# Patient Record
Sex: Male | Born: 1945 | Race: White | Hispanic: No | Marital: Married | Smoking: Never smoker
Health system: Southern US, Community
[De-identification: ages and names within clinical notes are randomized; demographics above are authoritative.]

## PROBLEM LIST (undated history)

## (undated) DIAGNOSIS — M1990 Unspecified osteoarthritis, unspecified site: Secondary | ICD-10-CM

## (undated) HISTORY — PX: JOINT REPLACEMENT: SHX530

---

## 1991-07-29 HISTORY — PX: EYE SURGERY: SHX253

## 1996-07-28 HISTORY — PX: KNEE ARTHROSCOPY: SHX127

## 2016-07-28 HISTORY — PX: EYE SURGERY: SHX253

## 2019-04-25 ENCOUNTER — Other Ambulatory Visit: Payer: Self-pay | Admitting: Orthopedic Surgery

## 2019-05-11 ENCOUNTER — Other Ambulatory Visit: Payer: Self-pay | Admitting: Orthopedic Surgery

## 2019-05-13 NOTE — Patient Instructions (Addendum)
DUE TO COVID-19 ONLY ONE VISITOR IS ALLOWED TO COME WITH YOU AND STAY IN THE WAITING ROOM ONLY DURING PRE OP AND PROCEDURE DAY OF SURGERY. THE 1 VISITOR MAY VISIT WITH YOU AFTER SURGERY IN YOUR PRIVATE ROOM DURING VISITING HOURS ONLY!  YOU NEED TO HAVE A COVID 19 TEST ON 05-16-19  @ 2:25 PM, THIS TEST MUST BE DONE BEFORE SURGERY, COME  Lance Gray, Lance Gray , [id number].  ([place]) ONCE YOUR COVID TEST IS COMPLETED, PLEASE BEGIN THE QUARANTINE INSTRUCTIONS AS OUTLINED IN YOUR HANDOUT.                Lance Gray  05/13/2019   Your procedure is scheduled on: 05-19-19    Report to [facility] Main  Entrance    Report to Admitting at 7:30 AM     Call this number if you have problems the morning of surgery [phone]    Remember: NO SOLID FOOD AFTER MIDNIGHT THE NIGHT PRIOR TO SURGERY. NOTHING BY MOUTH EXCEPT CLEAR LIQUIDS UNTIL 7:00 AM . PLEASE FINISH ENSURE DRINK PER SURGEON ORDER  WHICH NEEDS TO BE COMPLETED AT 7:00 AM .   CLEAR LIQUID DIET   Foods Allowed                                                                     Foods Excluded  Coffee and tea, regular and decaf                             liquids that you cannot  Plain Jell-O any favor except red or purple                                           see through such as: Fruit ices (not with fruit pulp)                                     milk, soups, orange juice  Iced Popsicles                                    All solid food Carbonated beverages, regular and diet                                    Cranberry, grape and apple juices Sports drinks like Gatorade Lightly seasoned clear broth or consume(fat free) Sugar, honey syrup   _____________________________________________________________________       Take these medicines the morning of surgery with A SIP OF WATER: None   BRUSH YOUR TEETH MORNING OF SURGERY AND RINSE YOUR MOUTH OUT, NO CHEWING GUM CANDY OR MINTS.                              You may not have any metal on your body including hair  pins and              piercings     Do not wear jewelry, cologne, lotions, powders or perfumes, deodorant                        Men may shave face and neck.   Do not bring valuables to the [facility]. [facility] IS NOT             RESPONSIBLE   FOR VALUABLES.  Contacts, dentures or bridgework may not be worn into surgery.  Leave suitcase in the car. After surgery it may be brought to your room.     Special Instructions: N/A              Please read over the following fact sheets you were given: _____________________________________________________________________             Lance Gray - Preparing for Surgery Before surgery, you can play an important role.  Because skin is not sterile, your skin needs to be as free of germs as possible.  You can reduce the number of germs on your skin by washing with CHG (chlorahexidine gluconate) soap before surgery.  CHG is an antiseptic cleaner which kills germs and bonds with the skin to continue killing germs even after washing. Please DO NOT use if you have an allergy to CHG or antibacterial soaps.  If your skin becomes reddened/irritated stop using the CHG and inform your nurse when you arrive at Short Stay. Do not shave (including legs and underarms) for at least 48 hours prior to the first CHG shower.  You may shave your face/neck. Please follow these instructions carefully:  1.  Shower with CHG Soap the night before surgery and the  morning of Surgery.  2.  If you choose to wash your hair, wash your hair first as usual with your  normal  shampoo.  3.  After you shampoo, rinse your hair and body thoroughly to remove the  shampoo.                           4.  Use CHG as you would any other liquid soap.  You can apply chg directly  to the skin and wash                       Gently with a scrungie or clean washcloth.  5.  Apply the CHG Soap to your body ONLY FROM  THE NECK DOWN.   Do not use on face/ open                           Wound or open sores. Avoid contact with eyes, ears mouth and genitals (private parts).                       Wash face,  Genitals (private parts) with your normal soap.             6.  Wash thoroughly, paying special attention to the area where your surgery  will be performed.  7.  Thoroughly rinse your body with warm water from the neck down.  8.  DO NOT shower/wash with your normal soap after using and rinsing off  the CHG Soap.  9.  Pat yourself dry with a clean towel.            10.  Wear clean pajamas.            11.  Place clean sheets on your bed the night of your first shower and do not  sleep with pets. Day of Surgery : Do not apply any lotions/deodorants the morning of surgery.  Please wear clean clothes to the [facility]/surgery center.  FAILURE TO FOLLOW THESE INSTRUCTIONS MAY RESULT IN THE CANCELLATION OF YOUR SURGERY PATIENT SIGNATURE_________________________________  NURSE SIGNATURE__________________________________  ________________________________________________________________________   Lance Gray  An incentive spirometer is a tool that can help keep your lungs clear and active. This tool measures how well you are filling your lungs with each breath. Taking long deep breaths may help reverse or decrease the chance of developing breathing (pulmonary) problems (especially infection) following:  A long period of time when you are unable to move or be active. BEFORE THE PROCEDURE   If the spirometer includes an indicator to show your best effort, your nurse or respiratory therapist will set it to a desired goal.  If possible, sit up straight or lean slightly forward. Try not to slouch.  Hold the incentive spirometer in an upright position. INSTRUCTIONS FOR USE  1. Sit on the edge of your bed if possible, or sit up as far as you can in bed or on a chair. 2. Hold the incentive  spirometer in an upright position. 3. Breathe out normally. 4. Place the mouthpiece in your mouth and seal your lips tightly around it. 5. Breathe in slowly and as deeply as possible, raising the piston or the ball toward the top of the column. 6. Hold your breath for 3-5 seconds or for as long as possible. Allow the piston or ball to fall to the bottom of the column. 7. Remove the mouthpiece from your mouth and breathe out normally. 8. Rest for a few seconds and repeat Steps 1 through 7 at least 10 times every 1-2 hours when you are awake. Take your time and take a few normal breaths between deep breaths. 9. The spirometer may include an indicator to show your best effort. Use the indicator as a goal to work toward during each repetition. 10. After each set of 10 deep breaths, practice coughing to be sure your lungs are clear. If you have an incision (the cut made at the time of surgery), support your incision when coughing by placing a pillow or rolled up towels firmly against it. Once you are able to get out of bed, walk around indoors and cough well. You may stop using the incentive spirometer when instructed by your caregiver.  RISKS AND COMPLICATIONS  Take your time so you do not get dizzy or light-headed.  If you are in pain, you may need to take or ask for pain medication before doing incentive spirometry. It is harder to take a deep breath if you are having pain. AFTER USE  Rest and breathe slowly and easily.  It can be helpful to keep track of a log of your progress. Your caregiver can provide you with a simple table to help with this. If you are using the spirometer at home, follow these instructions: [place] IF:   You are having difficultly using the spirometer.  You have trouble using the spirometer as often as instructed.  Your pain medication is not giving enough relief while using the spirometer.  You develop  fever of 100.5 F (38.1 C) or higher. SEEK  IMMEDIATE MEDICAL CARE IF:   You cough up bloody sputum that had not been present before.  You develop fever of 102 F (38.9 C) or greater.  You develop worsening pain at or near the incision site. MAKE SURE YOU:   Understand these instructions.  Will watch your condition.  Will get help right away if you are not doing well or get worse. Document Released: 11/24/2006 Document Revised: 10/06/2011 Document Reviewed: 01/25/2007 [facility] Patient Information 2014 [place], [place].   ________________________________________________________________________

## 2019-05-16 ENCOUNTER — Encounter (HOSPITAL_COMMUNITY)
Admission: RE | Admit: 2019-05-16 | Discharge: 2019-05-16 | Disposition: A | Discharge status: Home / Self Care | Payer: Medicare Other | Source: Ambulatory Visit | Attending: Orthopedic Surgery | Admitting: Orthopedic Surgery

## 2019-05-16 ENCOUNTER — Ambulatory Visit (HOSPITAL_COMMUNITY)
Admission: RE | Admit: 2019-05-16 | Discharge: 2019-05-16 | Disposition: A | Discharge status: Home / Self Care | Payer: Medicare Other | Source: Ambulatory Visit | Attending: Orthopedic Surgery | Admitting: Orthopedic Surgery

## 2019-05-16 ENCOUNTER — Encounter (HOSPITAL_COMMUNITY): Payer: Self-pay

## 2019-05-16 ENCOUNTER — Other Ambulatory Visit: Payer: Self-pay

## 2019-05-16 ENCOUNTER — Encounter (INDEPENDENT_AMBULATORY_CARE_PROVIDER_SITE_OTHER): Payer: Self-pay

## 2019-05-16 ENCOUNTER — Other Ambulatory Visit (HOSPITAL_COMMUNITY)
Admission: RE | Admit: 2019-05-16 | Discharge: 2019-05-16 | Disposition: A | Discharge status: Home / Self Care | Payer: Medicare Other | Source: Ambulatory Visit | Attending: Orthopedic Surgery | Admitting: Orthopedic Surgery

## 2019-05-16 DIAGNOSIS — Z01811 Encounter for preprocedural respiratory examination: Secondary | ICD-10-CM

## 2019-05-16 HISTORY — DX: Unspecified osteoarthritis, unspecified site: M19.90

## 2019-05-16 LAB — CBC WITH DIFFERENTIAL/PLATELET
Abs Immature Granulocytes: 0.02 10*3/uL (ref 0.00–0.07)
Basophils Absolute: 0 10*3/uL (ref 0.0–0.1)
Basophils Relative: 1 %
Eosinophils Absolute: 0.1 10*3/uL (ref 0.0–0.5)
Eosinophils Relative: 1 %
HCT: 42.3 % (ref 39.0–52.0)
Hemoglobin: 14.1 g/dL (ref 13.0–17.0)
Immature Granulocytes: 0 %
Lymphocytes Relative: 31 %
Lymphs Abs: 1.7 10*3/uL (ref 0.7–4.0)
MCH: 30.9 pg (ref 26.0–34.0)
MCHC: 33.3 g/dL (ref 30.0–36.0)
MCV: 92.6 fL (ref 80.0–100.0)
Monocytes Absolute: 0.4 10*3/uL (ref 0.1–1.0)
Monocytes Relative: 8 %
Neutro Abs: 3.1 10*3/uL (ref 1.7–7.7)
Neutrophils Relative %: 59 %
Platelets: 216 10*3/uL (ref 150–400)
RBC: 4.57 MIL/uL (ref 4.22–5.81)
RDW: 12.4 % (ref 11.5–15.5)
WBC: 5.3 10*3/uL (ref 4.0–10.5)
nRBC: 0 % (ref 0.0–0.2)

## 2019-05-16 LAB — URINALYSIS, ROUTINE W REFLEX MICROSCOPIC
Bilirubin Urine: NEGATIVE
Glucose, UA: NEGATIVE mg/dL
Hgb urine dipstick: NEGATIVE
Ketones, ur: NEGATIVE mg/dL
Leukocytes,Ua: NEGATIVE
Nitrite: NEGATIVE
Protein, ur: NEGATIVE mg/dL
Specific Gravity, Urine: 1.014 (ref 1.005–1.030)
pH: 5 (ref 5.0–8.0)

## 2019-05-16 LAB — COMPREHENSIVE METABOLIC PANEL
ALT: 20 U/L (ref 0–44)
AST: 23 U/L (ref 15–41)
Albumin: 4.3 g/dL (ref 3.5–5.0)
Alkaline Phosphatase: 46 U/L (ref 38–126)
Anion gap: 9 (ref 5–15)
BUN: 17 mg/dL (ref 8–23)
CO2: 25 mmol/L (ref 22–32)
Calcium: 9.2 mg/dL (ref 8.9–10.3)
Chloride: 107 mmol/L (ref 98–111)
Creatinine, Ser: 0.83 mg/dL (ref 0.61–1.24)
GFR calc Af Amer: >60 mL/min (ref >60)
GFR calc non Af Amer: >60 mL/min (ref >60)
Glucose, Bld: 107 mg/dL — ABNORMAL HIGH (ref 70–99)
Potassium: 4.1 mmol/L (ref 3.5–5.1)
Sodium: 141 mmol/L (ref 135–145)
Total Bilirubin: 0.6 mg/dL (ref 0.3–1.2)
Total Protein: 6.9 g/dL (ref 6.5–8.1)

## 2019-05-16 LAB — APTT: aPTT: 29 seconds (ref 24–36)

## 2019-05-16 LAB — SURGICAL PCR SCREEN
MRSA, PCR: NEGATIVE
Staphylococcus aureus: NEGATIVE

## 2019-05-16 LAB — PROTIME-INR
INR: 0.9 (ref 0.8–1.2)
Prothrombin Time: 12.2 seconds (ref 11.4–15.2)

## 2019-05-17 LAB — NOVEL CORONAVIRUS, NAA (HOSP ORDER, SEND-OUT TO REF LAB; TAT 18-24 HRS): SARS-CoV-2, NAA: NOT DETECTED

## 2019-05-19 ENCOUNTER — Other Ambulatory Visit: Payer: Self-pay

## 2019-05-19 ENCOUNTER — Encounter (HOSPITAL_COMMUNITY)
Admission: RE | Disposition: A | Discharge status: Home / Self Care | Payer: Self-pay | Source: Ambulatory Visit | Attending: Orthopedic Surgery

## 2019-05-19 ENCOUNTER — Inpatient Hospital Stay (HOSPITAL_COMMUNITY): Payer: Medicare Other | Admitting: Physician Assistant

## 2019-05-19 ENCOUNTER — Encounter (HOSPITAL_COMMUNITY): Payer: Self-pay | Admitting: Anesthesiology

## 2019-05-19 ENCOUNTER — Inpatient Hospital Stay (HOSPITAL_COMMUNITY)
Admission: RE | Admit: 2019-05-19 | Discharge: 2019-05-20 | DRG: 483 | Disposition: A | Discharge status: Home / Self Care | Payer: Medicare Other | Attending: Orthopedic Surgery | Admitting: Orthopedic Surgery

## 2019-05-19 ENCOUNTER — Inpatient Hospital Stay (HOSPITAL_COMMUNITY): Payer: Medicare Other

## 2019-05-19 ENCOUNTER — Inpatient Hospital Stay (HOSPITAL_COMMUNITY): Payer: Medicare Other | Admitting: Anesthesiology

## 2019-05-19 DIAGNOSIS — M25711 Osteophyte, right shoulder: Secondary | ICD-10-CM | POA: Diagnosis present

## 2019-05-19 DIAGNOSIS — Z96651 Presence of right artificial knee joint: Secondary | ICD-10-CM | POA: Diagnosis present

## 2019-05-19 DIAGNOSIS — M75121 Complete rotator cuff tear or rupture of right shoulder, not specified as traumatic: Secondary | ICD-10-CM | POA: Diagnosis present

## 2019-05-19 DIAGNOSIS — Z20828 Contact with and (suspected) exposure to other viral communicable diseases: Secondary | ICD-10-CM | POA: Diagnosis present

## 2019-05-19 DIAGNOSIS — Z96611 Presence of right artificial shoulder joint: Secondary | ICD-10-CM

## 2019-05-19 DIAGNOSIS — Z79899 Other long term (current) drug therapy: Secondary | ICD-10-CM

## 2019-05-19 DIAGNOSIS — M19011 Primary osteoarthritis, right shoulder: Secondary | ICD-10-CM | POA: Diagnosis present

## 2019-05-19 HISTORY — PX: TOTAL SHOULDER ARTHROPLASTY: SHX126

## 2019-05-19 LAB — GLUCOSE, CAPILLARY: Glucose-Capillary: 142 mg/dL — ABNORMAL HIGH (ref 70–99)

## 2019-05-19 SURGERY — ARTHROPLASTY, SHOULDER, TOTAL
Anesthesia: Regional | Site: Shoulder | Laterality: Right

## 2019-05-19 MED ORDER — PRAVASTATIN SODIUM 20 MG PO TABS
40.0000 mg | ORAL_TABLET | Freq: Every day | ORAL | Status: DC
  Administered 2019-05-19: 18:00:00 40 mg via ORAL
  Filled 2019-05-19: qty 2

## 2019-05-19 MED ORDER — FENTANYL CITRATE (PF) 100 MCG/2ML IJ SOLN
INTRAMUSCULAR | Status: AC
  Administered 2019-05-19: 10:00:00 50 ug via INTRAVENOUS
  Filled 2019-05-19: qty 2

## 2019-05-19 MED ORDER — BUPIVACAINE HCL (PF) 0.5 % IJ SOLN
INTRAMUSCULAR | Status: DC | PRN
  Administered 2019-05-19: 15 mL via PERINEURAL

## 2019-05-19 MED ORDER — EPHEDRINE 5 MG/ML INJ
INTRAVENOUS | Status: AC
  Filled 2019-05-19: qty 10

## 2019-05-19 MED ORDER — MIDAZOLAM HCL 2 MG/2ML IJ SOLN
1.0000 mg | INTRAMUSCULAR | Status: DC
  Administered 2019-05-19: 10:00:00 2 mg via INTRAVENOUS

## 2019-05-19 MED ORDER — FLEET ENEMA 7-19 GM/118ML RE ENEM: 1.0000 | ENEMA | Freq: Once | RECTAL | Status: DC | PRN

## 2019-05-19 MED ORDER — LACTATED RINGERS IV SOLN
INTRAVENOUS | Status: DC
  Administered 2019-05-19 (×2): via INTRAVENOUS

## 2019-05-19 MED ORDER — METOCLOPRAMIDE HCL 5 MG PO TABS: 5.0000 mg | ORAL_TABLET | Freq: Three times a day (TID) | ORAL | Status: DC | PRN

## 2019-05-19 MED ORDER — HYDROMORPHONE HCL 1 MG/ML IJ SOLN: 0.5000 mg | INTRAMUSCULAR | Status: DC | PRN

## 2019-05-19 MED ORDER — SODIUM CHLORIDE 0.9 % IV SOLN
INTRAVENOUS | Status: DC
  Administered 2019-05-19 – 2019-05-20 (×2): via INTRAVENOUS

## 2019-05-19 MED ORDER — CEFAZOLIN SODIUM-DEXTROSE 2-4 GM/100ML-% IV SOLN
2.0000 g | INTRAVENOUS | Status: AC
  Administered 2019-05-19: 10:00:00 2 g via INTRAVENOUS
  Filled 2019-05-19: qty 100

## 2019-05-19 MED ORDER — BISACODYL 5 MG PO TBEC: 5.0000 mg | DELAYED_RELEASE_TABLET | Freq: Every day | ORAL | Status: DC | PRN

## 2019-05-19 MED ORDER — PROPOFOL 10 MG/ML IV BOLUS
INTRAVENOUS | Status: DC | PRN
  Administered 2019-05-19: 150 mg via INTRAVENOUS
  Administered 2019-05-19 (×2): 50 mg via INTRAVENOUS

## 2019-05-19 MED ORDER — OXYCODONE HCL 5 MG PO TABS: 10.0000 mg | ORAL_TABLET | ORAL | Status: DC | PRN

## 2019-05-19 MED ORDER — METHOCARBAMOL 500 MG IVPB - SIMPLE MED
INTRAVENOUS | Status: AC
  Filled 2019-05-19: qty 50

## 2019-05-19 MED ORDER — TEMAZEPAM 15 MG PO CAPS: 30.0000 mg | ORAL_CAPSULE | Freq: Every day | ORAL | Status: DC

## 2019-05-19 MED ORDER — FENTANYL CITRATE (PF) 100 MCG/2ML IJ SOLN
50.0000 ug | INTRAMUSCULAR | Status: DC
  Administered 2019-05-19 (×2): 50 ug via INTRAVENOUS

## 2019-05-19 MED ORDER — METHOCARBAMOL 500 MG PO TABS: 500.0000 mg | ORAL_TABLET | Freq: Four times a day (QID) | ORAL | Status: DC | PRN

## 2019-05-19 MED ORDER — ALFUZOSIN HCL ER 10 MG PO TB24
10.0000 mg | ORAL_TABLET | Freq: Every day | ORAL | Status: DC
  Filled 2019-05-19: qty 1

## 2019-05-19 MED ORDER — BUPIVACAINE LIPOSOME 1.3 % IJ SUSP
INTRAMUSCULAR | Status: DC | PRN
  Administered 2019-05-19: 133 mg via PERINEURAL

## 2019-05-19 MED ORDER — ACETAMINOPHEN 10 MG/ML IV SOLN: 1000.0000 mg | Freq: Once | INTRAVENOUS | Status: DC | PRN

## 2019-05-19 MED ORDER — FENTANYL CITRATE (PF) 100 MCG/2ML IJ SOLN: 25.0000 ug | INTRAMUSCULAR | Status: DC | PRN

## 2019-05-19 MED ORDER — ALUM & MAG HYDROXIDE-SIMETH 200-200-20 MG/5ML PO SUSP: 30.0000 mL | ORAL | Status: DC | PRN

## 2019-05-19 MED ORDER — DOCUSATE SODIUM 100 MG PO CAPS
100.0000 mg | ORAL_CAPSULE | Freq: Two times a day (BID) | ORAL | Status: DC
  Administered 2019-05-19 – 2019-05-20 (×2): 100 mg via ORAL
  Filled 2019-05-19 (×2): qty 1

## 2019-05-19 MED ORDER — MENTHOL 3 MG MT LOZG: 1.0000 | LOZENGE | OROMUCOSAL | Status: DC | PRN

## 2019-05-19 MED ORDER — EPHEDRINE SULFATE-NACL 50-0.9 MG/10ML-% IV SOSY
PREFILLED_SYRINGE | INTRAVENOUS | Status: DC | PRN
  Administered 2019-05-19: 5 mg via INTRAVENOUS
  Administered 2019-05-19: 10 mg via INTRAVENOUS

## 2019-05-19 MED ORDER — OXYCODONE HCL 5 MG PO TABS: 5.0000 mg | ORAL_TABLET | ORAL | Status: DC | PRN

## 2019-05-19 MED ORDER — ONDANSETRON HCL 4 MG/2ML IJ SOLN: 4.0000 mg | Freq: Four times a day (QID) | INTRAMUSCULAR | Status: DC | PRN

## 2019-05-19 MED ORDER — DIPHENHYDRAMINE HCL 12.5 MG/5ML PO ELIX: 12.5000 mg | ORAL_SOLUTION | ORAL | Status: DC | PRN

## 2019-05-19 MED ORDER — ONDANSETRON HCL 4 MG/2ML IJ SOLN
INTRAMUSCULAR | Status: DC | PRN
  Administered 2019-05-19: 4 mg via INTRAVENOUS

## 2019-05-19 MED ORDER — OXYCODONE HCL 5 MG PO TABS: 5.0000 mg | ORAL_TABLET | Freq: Once | ORAL | Status: DC | PRN

## 2019-05-19 MED ORDER — OXYCODONE HCL 5 MG/5ML PO SOLN: 5.0000 mg | Freq: Once | ORAL | Status: DC | PRN

## 2019-05-19 MED ORDER — DEXAMETHASONE SODIUM PHOSPHATE 10 MG/ML IJ SOLN
INTRAMUSCULAR | Status: DC | PRN
  Administered 2019-05-19: 10 mg via INTRAVENOUS

## 2019-05-19 MED ORDER — ACETAMINOPHEN 160 MG/5ML PO SOLN: 1000.0000 mg | Freq: Once | ORAL | Status: DC | PRN

## 2019-05-19 MED ORDER — ZOLPIDEM TARTRATE 5 MG PO TABS
5.0000 mg | ORAL_TABLET | Freq: Every evening | ORAL | Status: DC | PRN
  Administered 2019-05-19: 5 mg via ORAL
  Filled 2019-05-19: qty 1

## 2019-05-19 MED ORDER — MIDAZOLAM HCL 2 MG/2ML IJ SOLN
INTRAMUSCULAR | Status: AC
  Administered 2019-05-19: 2 mg via INTRAVENOUS
  Filled 2019-05-19: qty 2

## 2019-05-19 MED ORDER — CHLORHEXIDINE GLUCONATE 4 % EX LIQD: 60.0000 mL | Freq: Once | CUTANEOUS | Status: DC

## 2019-05-19 MED ORDER — SUGAMMADEX SODIUM 200 MG/2ML IV SOLN
INTRAVENOUS | Status: DC | PRN
  Administered 2019-05-19: 200 mg via INTRAVENOUS

## 2019-05-19 MED ORDER — ROCURONIUM BROMIDE 10 MG/ML (PF) SYRINGE
PREFILLED_SYRINGE | INTRAVENOUS | Status: DC | PRN
  Administered 2019-05-19: 60 mg via INTRAVENOUS

## 2019-05-19 MED ORDER — TRANEXAMIC ACID-NACL 1000-0.7 MG/100ML-% IV SOLN
1000.0000 mg | INTRAVENOUS | Status: AC
  Administered 2019-05-19: 10:00:00 1000 mg via INTRAVENOUS
  Filled 2019-05-19: qty 100

## 2019-05-19 MED ORDER — FENTANYL CITRATE (PF) 100 MCG/2ML IJ SOLN
INTRAMUSCULAR | Status: AC
  Filled 2019-05-19: qty 4

## 2019-05-19 MED ORDER — METOCLOPRAMIDE HCL 5 MG/ML IJ SOLN: 5.0000 mg | Freq: Three times a day (TID) | INTRAMUSCULAR | Status: DC | PRN

## 2019-05-19 MED ORDER — GLYCOPYRROLATE PF 0.2 MG/ML IJ SOSY
PREFILLED_SYRINGE | INTRAMUSCULAR | Status: DC | PRN
  Administered 2019-05-19: .2 mg via INTRAVENOUS

## 2019-05-19 MED ORDER — ACETAMINOPHEN 500 MG PO TABS
1000.0000 mg | ORAL_TABLET | Freq: Four times a day (QID) | ORAL | Status: AC
  Administered 2019-05-19 – 2019-05-20 (×4): 1000 mg via ORAL
  Filled 2019-05-19 (×4): qty 2

## 2019-05-19 MED ORDER — SODIUM CHLORIDE 0.9 % IR SOLN
Status: DC | PRN
  Administered 2019-05-19 (×2): 1000 mL

## 2019-05-19 MED ORDER — SODIUM CHLORIDE 0.9 % IV SOLN
INTRAVENOUS | Status: DC | PRN
  Administered 2019-05-19: 10:00:00 40 ug/min via INTRAVENOUS

## 2019-05-19 MED ORDER — ONDANSETRON HCL 4 MG PO TABS: 4.0000 mg | ORAL_TABLET | Freq: Four times a day (QID) | ORAL | Status: DC | PRN

## 2019-05-19 MED ORDER — LIDOCAINE 2% (20 MG/ML) 5 ML SYRINGE
INTRAMUSCULAR | Status: DC | PRN
  Administered 2019-05-19: 100 mg via INTRAVENOUS

## 2019-05-19 MED ORDER — ASPIRIN EC 81 MG PO TBEC
81.0000 mg | DELAYED_RELEASE_TABLET | Freq: Two times a day (BID) | ORAL | Status: DC
  Administered 2019-05-19 – 2019-05-20 (×2): 81 mg via ORAL
  Filled 2019-05-19 (×2): qty 1

## 2019-05-19 MED ORDER — GLYCOPYRROLATE PF 0.2 MG/ML IJ SOSY
PREFILLED_SYRINGE | INTRAMUSCULAR | Status: AC
  Filled 2019-05-19: qty 5

## 2019-05-19 MED ORDER — METHOCARBAMOL 500 MG IVPB - SIMPLE MED
500.0000 mg | Freq: Four times a day (QID) | INTRAVENOUS | Status: DC | PRN
  Administered 2019-05-19: 500 mg via INTRAVENOUS
  Filled 2019-05-19: qty 50

## 2019-05-19 MED ORDER — CEFAZOLIN SODIUM-DEXTROSE 1-4 GM/50ML-% IV SOLN
1.0000 g | Freq: Four times a day (QID) | INTRAVENOUS | Status: AC
  Administered 2019-05-19 – 2019-05-20 (×3): 1 g via INTRAVENOUS
  Filled 2019-05-19 (×3): qty 50

## 2019-05-19 MED ORDER — ACETAMINOPHEN 500 MG PO TABS: 1000.0000 mg | ORAL_TABLET | Freq: Once | ORAL | Status: DC | PRN

## 2019-05-19 MED ORDER — PHENOL 1.4 % MT LIQD: 1.0000 | OROMUCOSAL | Status: DC | PRN

## 2019-05-19 MED ORDER — POLYETHYLENE GLYCOL 3350 17 G PO PACK: 17.0000 g | PACK | Freq: Every day | ORAL | Status: DC | PRN

## 2019-05-19 MED ORDER — ACETAMINOPHEN 325 MG PO TABS: 325.0000 mg | ORAL_TABLET | Freq: Four times a day (QID) | ORAL | Status: DC | PRN

## 2019-05-19 SURGICAL SUPPLY — 77 items
BAG ZIPLOCK 12X15 (MISCELLANEOUS) ×2 IMPLANT
BASEPLATE P2 COATD GLND 6.5X30 (Shoulder) ×1 IMPLANT
BIT DRILL 2.5 DIA 127 CALI (BIT) ×2 IMPLANT
BIT DRILL 4 DIA CALIBRATED (BIT) ×2 IMPLANT
BLADE SAW SAG 73X25 THK (BLADE) ×1
BLADE SAW SGTL 73X25 THK (BLADE) ×1 IMPLANT
BOOTIES KNEE HIGH SLOAN (MISCELLANEOUS) ×4 IMPLANT
COOLER ICEMAN CLASSIC (MISCELLANEOUS) ×2 IMPLANT
COVER BACK TABLE 60X90IN (DRAPES) ×2 IMPLANT
COVER SURGICAL LIGHT HANDLE (MISCELLANEOUS) ×2 IMPLANT
COVER WAND RF STERILE (DRAPES) IMPLANT
DRAPE INCISE IOBAN 66X45 STRL (DRAPES) ×2 IMPLANT
DRAPE ORTHO SPLIT 77X108 STRL (DRAPES) ×2
DRAPE POUCH INSTRU U-SHP 10X18 (DRAPES) ×2 IMPLANT
DRAPE SURG 17X11 SM STRL (DRAPES) ×2 IMPLANT
DRAPE SURG ORHT 6 SPLT 77X108 (DRAPES) ×2 IMPLANT
DRAPE U-SHAPE 47X51 STRL (DRAPES) ×2 IMPLANT
DRSG AQUACEL AG ADV 3.5X 6 (GAUZE/BANDAGES/DRESSINGS) ×2 IMPLANT
DRSG AQUACEL AG ADV 3.5X10 (GAUZE/BANDAGES/DRESSINGS) ×2 IMPLANT
DURAPREP 26ML APPLICATOR (WOUND CARE) IMPLANT
ELECT BLADE TIP CTD 4 INCH (ELECTRODE) ×2 IMPLANT
ELECT REM PT RETURN 15FT ADLT (MISCELLANEOUS) ×2 IMPLANT
EVACUATOR 1/8 PVC DRAIN (DRAIN) IMPLANT
FACESHIELD WRAPAROUND (MASK) ×2 IMPLANT
GLOVE BIO SURGEON STRL SZ7 (GLOVE) ×2 IMPLANT
GLOVE BIO SURGEON STRL SZ7.5 (GLOVE) ×2 IMPLANT
GLOVE BIOGEL PI IND STRL 7.5 (GLOVE) ×1 IMPLANT
GLOVE BIOGEL PI IND STRL 8 (GLOVE) ×1 IMPLANT
GLOVE BIOGEL PI INDICATOR 7.5 (GLOVE) ×1
GLOVE BIOGEL PI INDICATOR 8 (GLOVE) ×1
GOWN STRL REUS W/TWL LRG LVL3 (GOWN DISPOSABLE) ×2 IMPLANT
GOWN STRL REUS W/TWL XL LVL3 (GOWN DISPOSABLE) ×2 IMPLANT
HANDPIECE INTERPULSE COAX TIP (DISPOSABLE) ×1
HOOD PEEL AWAY FLYTE STAYCOOL (MISCELLANEOUS) ×6 IMPLANT
INSERT EPOLY STND HUMERUS 32MM (Shoulder) ×2 IMPLANT
INSERT EPOLYSTD HUMERUS 32MM (Shoulder) ×1 IMPLANT
KIT BASIN OR (CUSTOM PROCEDURE TRAY) ×2 IMPLANT
KIT TURNOVER KIT A (KITS) IMPLANT
MANIFOLD NEPTUNE II (INSTRUMENTS) ×2 IMPLANT
NEEDLE MA TROC 1/2 (NEEDLE) ×2 IMPLANT
NS IRRIG 1000ML POUR BTL (IV SOLUTION) ×2 IMPLANT
P2 COATDE GLNOID BSEPLT 6.5X30 (Shoulder) ×2 IMPLANT
PACK SHOULDER (CUSTOM PROCEDURE TRAY) ×2 IMPLANT
PAD COLD SHLDR WRAP-ON (PAD) ×2 IMPLANT
PROTECTOR NERVE ULNAR (MISCELLANEOUS) ×2 IMPLANT
RESTRAINT HEAD UNIVERSAL NS (MISCELLANEOUS) ×2 IMPLANT
RETRIEVER SUT HEWSON (MISCELLANEOUS) ×2 IMPLANT
SCREW BONE LOCKING RSP 5.0X30 (Screw) ×4 IMPLANT
SCREW BONE RSP LOCK 5X18 (Screw) ×2 IMPLANT
SCREW BONE RSP LOCK 5X30 (Screw) ×2 IMPLANT
SCREW BONE RSP LOCKING 18MM LG (Screw) ×4 IMPLANT
SCREW RETAIN W/HEAD 32MM (Shoulder) ×2 IMPLANT
SET HNDPC FAN SPRY TIP SCT (DISPOSABLE) ×1 IMPLANT
SLING ARM IMMOBILIZER LRG (SOFTGOODS) ×2 IMPLANT
SLING ARM IMMOBILIZER MED (SOFTGOODS) IMPLANT
SPONGE LAP 18X18 RF (DISPOSABLE) ×2 IMPLANT
STEM HUMERAL STD SHELL 10X48 (Stem) ×2 IMPLANT
STRIP CLOSURE SKIN 1/2X4 (GAUZE/BANDAGES/DRESSINGS) ×2 IMPLANT
SUCTION FRAZIER HANDLE 10FR (MISCELLANEOUS) ×1
SUCTION TUBE FRAZIER 10FR DISP (MISCELLANEOUS) ×1 IMPLANT
SUPPORT WRAP ARM LG (MISCELLANEOUS) ×2 IMPLANT
SUT ETHIBOND 2 OS 4 DA (SUTURE) IMPLANT
SUT ETHIBOND NAB CT1 #1 30IN (SUTURE) ×2 IMPLANT
SUT FIBERWIRE #2 38 REV NDL BL (SUTURE)
SUT MNCRL AB 4-0 PS2 18 (SUTURE) ×2 IMPLANT
SUT VIC AB 0 CT1 36 (SUTURE) IMPLANT
SUT VIC AB 2-0 CT1 27 (SUTURE) ×2
SUT VIC AB 2-0 CT1 TAPERPNT 27 (SUTURE) ×2 IMPLANT
SUTURE FIBERWR#2 38 REV NDL BL (SUTURE) IMPLANT
TAPE LABRALWHITE 1.5X36 (TAPE) ×2 IMPLANT
TAPE SUT LABRALTAP WHT/BLK (SUTURE) ×2 IMPLANT
TOWEL OR 17X26 10 PK STRL BLUE (TOWEL DISPOSABLE) ×2 IMPLANT
TOWEL OR NON WOVEN STRL DISP B (DISPOSABLE) ×2 IMPLANT
TOWER CARTRIDGE SMART MIX (DISPOSABLE) IMPLANT
WATER STERILE IRR 1000ML POUR (IV SOLUTION) ×4 IMPLANT
YANKAUER SUCT BULB TIP 10FT TU (MISCELLANEOUS) ×2 IMPLANT
humeral stem, std, shell (Orthopedic Implant) ×2 IMPLANT

## 2019-05-19 NOTE — Progress Notes (Addendum)
Assisted Dr. Ermalene Postin with a Right brachial plexus block. Side rails up, monitors on throughout procedure. See vital signs in flow sheet. Tolerated Procedure well.

## 2019-05-19 NOTE — H&P (Signed)
Lance Gray is an 73 y.o. male.   Chief Complaint: R shoulder pain and dysfunction HPI: Worsening right shoulder pain and dysfunction with history of irreparable right shoulder rotator cuff tear, now with pseudoparalysis and pain.  Failed conservative management.  Past Medical History:  Diagnosis Date  . Arthritis     Past Surgical History:  Procedure Laterality Date  . EYE SURGERY Left 1993   Cataract  . EYE SURGERY  2018   Lens  . JOINT REPLACEMENT    . KNEE ARTHROSCOPY Right 1998    History reviewed. No pertinent family history. Social History:  reports that he has never smoked. He has never used smokeless tobacco. He reports that he does not drink alcohol or use drugs.  Allergies: No Known Allergies  Medications Prior to Admission  Medication Sig Dispense Refill  . alfuzosin (UROXATRAL) 10 MG 24 hr tablet Take 10 mg by mouth daily with breakfast.    . Carboxymethylcell-Glycerin PF (REFRESH RELIEVA PF) 0.5-0.9 % SOLN Place 1 drop into the left eye 3 (three) times daily as needed (dry eyes).    Marland Kitchen lovastatin (MEVACOR) 20 MG tablet Take 40 mg by mouth at bedtime.    . Multiple Vitamins-Minerals (MULTIVITAMIN WITH MINERALS) tablet Take 1 tablet by mouth daily.    . temazepam (RESTORIL) 30 MG capsule Take 30 mg by mouth at bedtime.      No results found for this or any previous visit (from the past 48 hour(s)). No results found.  Review of Systems  All other systems reviewed and are negative.   Height 5\' 9"  (1.753 m), weight 72.6 kg. Physical Exam  Constitutional: He is oriented to person, place, and time. He appears well-developed and well-nourished.  HENT:  Head: Atraumatic.  Eyes: EOM are normal.  Cardiovascular: Intact distal pulses.  Respiratory: Effort normal.  Musculoskeletal:     Comments: R shoulder pain with pseudoparalysis in forward flexion.  Neurological: He is alert and oriented to person, place, and time.  Skin: Skin is warm and dry.  Psychiatric:  He has a normal mood and affect.     Assessment/Plan Worsening right shoulder pain and dysfunction with history of irreparable right shoulder rotator cuff tear, now with pseudoparalysis and pain.  Failed conservative management. Plan right reverse total shoulder arthroplasty Risks / benefits of surgery discussed Consent on chart  NPO for OR Preop antibiotics   Isabella Stalling, MD 05/19/2019, 8:54 AM

## 2019-05-19 NOTE — Anesthesia Preprocedure Evaluation (Signed)
Anesthesia Evaluation  Patient identified by MRN, date of birth, ID band Patient awake    Reviewed: Allergy & Precautions, NPO status , Patient's Chart, lab work & pertinent test results  History of Anesthesia Complications Negative for: history of anesthetic complications  Airway Mallampati: II  TM Distance: >3 FB Neck ROM: Full    Dental  (+) Dental Advisory Given, Teeth Intact   Pulmonary neg pulmonary ROS, neg recent URI,    breath sounds clear to auscultation       Cardiovascular negative cardio ROS   Rhythm:Regular     Neuro/Psych negative neurological ROS  negative psych ROS   GI/Hepatic negative GI ROS, Neg liver ROS,   Endo/Other  negative endocrine ROS  Renal/GU negative Renal ROS     Musculoskeletal  (+) Arthritis ,   Abdominal   Peds  Hematology negative hematology ROS (+)   Anesthesia Other Findings   Reproductive/Obstetrics                             Anesthesia Physical Anesthesia Plan  ASA: II  Anesthesia Plan: General and Regional   Post-op Pain Management:  Regional for Post-op pain   Induction:   PONV Risk Score and Plan: 2 and Ondansetron and Dexamethasone  Airway Management Planned: Oral ETT  Additional Equipment: None  Intra-op Plan:   Post-operative Plan: Extubation in OR  Informed Consent: I have reviewed the patients History and Physical, chart, labs and discussed the procedure including the risks, benefits and alternatives for the proposed anesthesia with the patient or authorized representative who has indicated his/her understanding and acceptance.     Dental advisory given  Plan Discussed with: CRNA and Surgeon  Anesthesia Plan Comments:         Anesthesia Quick Evaluation

## 2019-05-19 NOTE — Anesthesia Procedure Notes (Signed)
Anesthesia Regional Block: Axillary brachial plexus block   Pre-Anesthetic Checklist: ,, timeout performed, Correct Patient, Correct Site, Correct Laterality, Correct Procedure, Correct Position, site marked, Risks and benefits discussed,  Surgical consent,  Pre-op evaluation,  At surgeon's request and post-op pain management  Laterality: Right and Upper  Prep: chloraprep       Needles:  Injection technique: Single-shot     Needle Length: 5cm  Needle Gauge: 22     Additional Needles: Arrow StimuQuik ECHO Echogenic Stimulating PNB Needle  Procedures:,,,, ultrasound used (permanent image in chart),,,,  Narrative:  Start time: 05/19/2019 9:41 AM End time: 05/19/2019 9:48 AM Injection made incrementally with aspirations every 5 mL.  Performed by: Personally  Anesthesiologist: Oleta Mouse, MD

## 2019-05-19 NOTE — Transfer of Care (Signed)
Immediate Anesthesia Transfer of Care Note  Patient: Lance Gray  Procedure(s) Performed: Procedure(s): REVERSE TOTAL SHOULDER ARTHROPLASTY (Right)  Patient Location: PACU  Anesthesia Type:General  Level of Consciousness:  sedated, patient cooperative and responds to stimulation  Airway & Oxygen Therapy:Patient Spontanous Breathing and Patient connected to face mask oxgen  Post-op Assessment:  Report given to PACU RN and Post -op Vital signs reviewed and stable  Post vital signs:  Reviewed and stable  Last Vitals:  Vitals:   05/19/19 0945 05/19/19 0946  BP: (!) 145/99   Pulse: 63 64  Resp: 17 17  Temp:    SpO2: 407% 680%    Complications: No apparent anesthesia complications

## 2019-05-19 NOTE — Plan of Care (Signed)

## 2019-05-19 NOTE — Anesthesia Procedure Notes (Signed)
Procedure Name: Intubation Date/Time: 05/19/2019 10:21 AM Performed by: Lavina Hamman, CRNA Pre-anesthesia Checklist: Patient identified, Emergency Drugs available, Suction available, Patient being monitored and Timeout performed Patient Re-evaluated:Patient Re-evaluated prior to induction Oxygen Delivery Method: Circle system utilized Preoxygenation: Pre-oxygenation with 100% oxygen Induction Type: IV induction and Cricoid Pressure applied Ventilation: Mask ventilation without difficulty Laryngoscope Size: Mac and 4 Grade View: Grade III Tube type: Oral Tube size: 7.0 mm Number of attempts: 3 Airway Equipment and Method: Stylet and Video-laryngoscopy Placement Confirmation: ETT inserted through vocal cords under direct vision,  positive ETCO2,  CO2 detector and breath sounds checked- equal and bilateral Secured at: 23 cm Tube secured with: Tape Dental Injury: Teeth and Oropharynx as per pre-operative assessment  Difficulty Due To: Difficult Airway- due to dentition, Difficult Airway- due to limited oral opening, Difficult Airway- due to anterior larynx and Difficult Airway- due to immobile epiglottis Comments: Large epiglottis, Grade 1 view with Glidescope 3.   DL by Garritt Molyneux with Mac 4 Grade 3, DL by Ermalene Postin with Sabra Heck 3, Grade3 view.

## 2019-05-19 NOTE — Discharge Instructions (Signed)

## 2019-05-19 NOTE — Op Note (Signed)
Procedure(s): REVERSE TOTAL SHOULDER ARTHROPLASTY Procedure Note  Lance Gray male 73 y.o. 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 male  With right shoulder arthritis with irrepairable rotator cuff tear. Pain and dysfunction interfered with quality of life and nonoperative treatment with activity modification, NSAIDS and injections failed.     Surgeon: Berline Lopes   Assistants: Damita Lack PA-C [facility] was present and scrubbed throughout the procedure and was essential in positioning, retraction, exposure, and closure)  Anesthesia: General endotracheal anesthesia with preoperative interscalene block given by the attending anesthesiologist     Procedure Detail  REVERSE TOTAL SHOULDER ARTHROPLASTY   Estimated Blood Loss:  200 mL         Drains: none  Blood Given: none          Specimens: none        Complications:  * No complications entered in OR log *         Disposition: PACU - hemodynamically stable.         Condition: stable      OPERATIVE FINDINGS:  A DJO Altivate pressfit reverse total shoulder arthroplasty was placed with a  size 10 stem, a 32 glenosphere, and a standard poly insert. The base plate  fixation was excellent.  PROCEDURE: The patient was identified in the preoperative holding area  where I personally marked the operative site after verifying site, side,  and procedure with the patient. An interscalene block given by  the attending anesthesiologist in the holding area and the patient was taken back to the operating room where all extremities were  carefully padded in position after general anesthesia was induced. She  was placed in a beach-chair position and the operative upper extremity was  prepped and draped in a  standard sterile fashion. An approximately 10-  cm incision was made from the tip of the coracoid process to the center  point of the humerus at the level of the axilla. Dissection was carried  down through subcutaneous tissues to the level of the cephalic vein  which was taken laterally with the deltoid. The pectoralis major was  retracted medially. The subdeltoid space was developed and the lateral  edge of the conjoined tendon was identified. The undersurface of  conjoined tendon was palpated and the musculocutaneous nerve was not in  the field. Retractor was placed underneath the conjoined and second  retractor was placed lateral into the deltoid. The circumflex humeral  artery and vessels were identified and clamped and coagulated. The  biceps tendon was absent.  The subscapularis was taken down as a peel and was very thin.  The  joint was then gently externally rotated while the capsule was released  from the humeral neck around to just beyond the 6 o'clock position. At  this point, the joint was dislocated and the humeral head was presented  into the wound. The excessive osteophyte formation was removed with a  large rongeur.  The cutting guide was used to make the appropriate  head cut and the head was saved for potentially bone grafting.  The glenoid was exposed with the arm in an  abducted extended position. The anterior and posterior labrum were  completely excised and the capsule was released circumferentially to  allow for exposure of the glenoid for preparation. The 2.5 mm  drill was  placed using the guide in 5-10 inferior angulation and the tap was then advanced in the same hole. Small and large reamers were then used. The tap was then removed and the Metaglene was then screwed in with excellent purchase.  The peripheral guide was then used to drilled measured and filled peripheral locking screws. The size 32 glenosphere was then impacted on the [facility] taper and the central screw  was placed. The humerus was then again exposed and the diaphyseal reamers were used followed by the metaphyseal reamers. The final broach was left in place in the proximal trial was placed. The joint was reduced and with this implant it was felt that soft tissue tensioning was appropriate with excellent stability and excellent range of motion. Therefore, final humeral stem was placed press-fit.  And then the trial polyethylene inserts were tested again and the above implant was felt to be the most appropriate for final insertion. The joint was reduced taken through full range of motion and felt to be stable. Soft tissue tension was appropriate.  The joint was then copiously irrigated with pulse  lavage and the wound was then closed. The subscapularis was not repaired.  Skin was closed with 2-0 Vicryl in a deep dermal layer and 4-0  Monocryl for skin closure. Steri-Strips were applied. Sterile  dressings were then applied as well as a sling. The patient was allowed  to awaken from general anesthesia, transferred to stretcher, and taken  to recovery room in stable condition.   POSTOPERATIVE PLAN: The patient will be kept in the hospital postoperatively  for pain control and therapy.

## 2019-05-19 NOTE — Significant Event (Signed)
Rapid Response Event Note  Overview: Time Called: 1430 Arrival Time: 1433 Event Type: Neurologic  Initial Focused Assessment:  Called to room by bedside RN due to possible syncopal event. Patient was ambulating to restroom due to inability to void post op. Upon standing patient felt "off" and was unable to bend legs became light headed, was assisted back to bed by staff. MD paged, 250 ml bolus ordered and straight cath to empty patients bladder. Patient now resting in bed, vital signs stable and patient expressing feeling back to baseline. RRT available as needed.    Plan of Care (if not transferred):  Event Summary: Name of Physician Notified: Tamera Punt, J at 1400    at    Outcome: Stayed in room and stabalized  Event End Time: [place]

## 2019-05-19 NOTE — Progress Notes (Signed)
Patient was admitted to floor around 1pm and voided very soon after but didn't feel like he emptied his bladder all the way. In about an hour he wanted to stand to side of the bed to help him urinate this didn't relieve him so he wanted to go into restroom with assistance.  About half way to restroom patient c/o of feeling terrible and starts to slow down on responding. I asked his wife to help put chair behind patient but patient would not bend his knees. I pushed emergency response to help get him in bed and not fall. Rapid RN was called as HR was low 30 to 120's. He stabilized, MD called, EKG done, NS bolus given and later I/o Cath was done. Patient started to feel better within minutes of laying down in bed and responded more appropriately. Md rounded about an hour later and checked patient out. Pt knows not to get up on his own and to call for staff if needing to get up. Staff knows to have 2 assist just incase event were to happen again.

## 2019-05-20 LAB — BASIC METABOLIC PANEL
Anion gap: 7 (ref 5–15)
BUN: 14 mg/dL (ref 8–23)
CO2: 22 mmol/L (ref 22–32)
Calcium: 8.4 mg/dL — ABNORMAL LOW (ref 8.9–10.3)
Chloride: 110 mmol/L (ref 98–111)
Creatinine, Ser: 0.72 mg/dL (ref 0.61–1.24)
GFR calc Af Amer: >60 mL/min (ref >60)
GFR calc non Af Amer: >60 mL/min (ref >60)
Glucose, Bld: 135 mg/dL — ABNORMAL HIGH (ref 70–99)
Potassium: 4 mmol/L (ref 3.5–5.1)
Sodium: 139 mmol/L (ref 135–145)

## 2019-05-20 LAB — CBC
HCT: 37.9 % — ABNORMAL LOW (ref 39.0–52.0)
Hemoglobin: 12.5 g/dL — ABNORMAL LOW (ref 13.0–17.0)
MCH: 30.6 pg (ref 26.0–34.0)
MCHC: 33 g/dL (ref 30.0–36.0)
MCV: 92.9 fL (ref 80.0–100.0)
Platelets: 195 10*3/uL (ref 150–400)
RBC: 4.08 MIL/uL — ABNORMAL LOW (ref 4.22–5.81)
RDW: 12.6 % (ref 11.5–15.5)
WBC: 10.6 10*3/uL — ABNORMAL HIGH (ref 4.0–10.5)
nRBC: 0 % (ref 0.0–0.2)

## 2019-05-20 MED ORDER — OXYCODONE-ACETAMINOPHEN 5-325 MG PO TABS: ORAL_TABLET | ORAL | 0 refills | Status: AC

## 2019-05-20 MED ORDER — TIZANIDINE HCL 4 MG PO TABS: 4.0000 mg | ORAL_TABLET | Freq: Three times a day (TID) | ORAL | 1 refills | Status: AC | PRN

## 2019-05-20 NOTE — Discharge Summary (Signed)
Patient ID: Lance Gray MRN: [id number] DOB/AGE: [birth year] 73 y.o.  Admit date: 05/19/2019 Discharge date: 05/20/2019  Admission Diagnoses:  Active Problems:   S/P reverse total shoulder arthroplasty, right   Discharge Diagnoses:  Same  Past Medical History:  Diagnosis Date  . Arthritis     Surgeries: Procedure(s): REVERSE TOTAL SHOULDER ARTHROPLASTY on 05/19/2019   Consultants:   Discharged Condition: Improved  Hospital Course: Lance Gray is an 73 y.o. male who was admitted 05/19/2019 for operative treatment of right chronic irreparable rotator cuff tear. Patient has severe unremitting pain that affects sleep, daily activities, and work/hobbies. After pre-op clearance the patient was taken to the operating room on 05/19/2019 and underwent  Procedure(s): REVERSE TOTAL SHOULDER ARTHROPLASTY.    Patient was given perioperative antibiotics:  Anti-infectives (From admission, onward)   Start     Dose/Rate Route Frequency Ordered Stop   05/19/19 1630  ceFAZolin (ANCEF) IVPB 1 g/50 mL premix     1 g 100 mL/hr over 30 Minutes Intravenous Every 6 hours 05/19/19 1309 05/20/19 0457   05/19/19 0800  ceFAZolin (ANCEF) IVPB 2g/100 mL premix     2 g 200 mL/hr over 30 Minutes Intravenous On call to O.R. 05/19/19 0751 05/19/19 1006       Patient was given sequential compression devices, early ambulation, and asa to prevent DVT.  Patient benefited maximally from hospital stay and there were no complications.    Recent vital signs:  Patient Vitals for the past 24 hrs:  BP Temp Temp src Pulse Resp SpO2  05/20/19 0542 135/86 97.8 F (36.6 C) Oral 68 16 99 %  05/20/19 0134 127/86 98 F (36.7 C) Oral 74 14 97 %  05/19/19 2145 (!) 145/81 98.1 F (36.7 C) Oral 79 16 100 %  05/19/19 1612 140/80 98.1 F (36.7 C) Oral 61 18 -  05/19/19 1436 127/88 - - (!) 59 - 100 %  05/19/19 1427 (!) 115/50 - - 61 - -  05/19/19 1423 111/68 - - (!) 52 - -  05/19/19 1420 (!) 165/135 - - (!)  120 - -  05/19/19 1356 (!) 151/86 - - (!) 57 18 100 %  05/19/19 1303 (!) 159/79 (!) 97.4 F (36.3 C) Oral (!) 55 16 100 %  05/19/19 1245 (!) 147/83 (!) 97.3 F (36.3 C) - (!) 56 17 100 %  05/19/19 1230 (!) 158/86 - - (!) 56 16 100 %  05/19/19 1215 (!) 145/90 - - 64 11 100 %  05/19/19 1200 (!) 148/97 - - 64 19 100 %  05/19/19 1156 (!) 148/87 - - 67 16 100 %  05/19/19 1145 - - - 61 17 100 %  05/19/19 1132 (!) 161/85 (!) 97.5 F (36.4 C) - 66 15 100 %  05/19/19 0946 - - - 64 17 100 %  05/19/19 0945 (!) 145/99 - - 63 17 100 %  05/19/19 0944 - - - 63 18 100 %  05/19/19 0943 - - - 63 12 100 %  05/19/19 0942 - - - 63 11 100 %  05/19/19 0941 - - - 66 13 100 %  05/19/19 0940 (!) 162/90 - - 66 18 100 %  05/19/19 0939 - - - 64 12 100 %  05/19/19 0938 - - - 74 14 100 %  05/19/19 0937 - - - 68 11 100 %  05/19/19 0936 (!) 160/94 - - 68 14 100 %     Recent laboratory studies:  Recent Labs    05/20/19  0242  WBC 10.6*  HGB 12.5*  HCT 37.9*  PLT 195  NA 139  K 4.0  CL 110  CO2 22  BUN 14  CREATININE 0.72  GLUCOSE 135*  CALCIUM 8.4*     Discharge Medications:   Allergies as of 05/20/2019   No Known Allergies     Medication List    TAKE these medications   alfuzosin 10 MG 24 hr tablet Commonly known as: UROXATRAL Take 10 mg by mouth daily with breakfast.   lovastatin 20 MG tablet Commonly known as: MEVACOR Take 40 mg by mouth at bedtime.   multivitamin with minerals tablet Take 1 tablet by mouth daily.   oxyCODONE-acetaminophen 5-325 MG tablet Commonly known as: Percocet Take 1-2 tablets every 4 hours as needed for post operative pain. MAX 6/day   Refresh Relieva PF 0.5-0.9 % Soln Generic drug: Carboxymethylcell-Glycerin PF Place 1 drop into the left eye 3 (three) times daily as needed (dry eyes).   temazepam 30 MG capsule Commonly known as: RESTORIL Take 30 mg by mouth at bedtime.   tiZANidine 4 MG tablet Commonly known as: Zanaflex Take 1 tablet (4 mg  total) by mouth every 8 (eight) hours as needed for muscle spasms.       Diagnostic Studies: Dg Chest 2 View  Result Date: 05/16/2019 CLINICAL DATA:  Pre op total shoulder replacement - never a smoker - pt states no there chest hx or complaints EXAM: CHEST - 2 VIEW COMPARISON:  None. FINDINGS: Normal mediastinum and cardiac silhouette. Normal pulmonary vasculature. No evidence of effusion, infiltrate, or pneumothorax. No acute bony abnormality. IMPRESSION: No acute cardiopulmonary process. Electronically Signed   By: Genevive Bi M.D.   On: 05/16/2019 14:19   Dg Shoulder Right Port  Result Date: 05/19/2019 CLINICAL DATA:  Status post right shoulder arthroplasty. EXAM: PORTABLE RIGHT SHOULDER COMPARISON:  Dec 21, 2018. FINDINGS: The right glenoid and humeral components appear to be well situated. No fracture or dislocation is noted. Mild degenerative changes seen involving the right acromioclavicular joint. Visualized ribs are unremarkable. IMPRESSION: Status post right total shoulder arthroplasty. Electronically Signed   By: Lupita Raider M.D.   On: 05/19/2019 11:54    Disposition: Discharge disposition: 01-Home or Self Care       Discharge Instructions    Call MD / Call 911   Complete by: As directed    If you experience chest pain or shortness of breath, CALL 911 and be transported to the hospital emergency room.  If you develope a fever above 101 F, pus (white drainage) or increased drainage or redness at the wound, or calf pain, call your surgeon's office.   Constipation Prevention   Complete by: As directed    Drink plenty of fluids.  Prune juice may be helpful.  You may use a stool softener, such as Colace (over the counter) 100 mg twice a day.  Use MiraLax (over the counter) for constipation as needed.   Diet - low sodium heart healthy   Complete by: As directed    Increase activity slowly as tolerated   Complete by: As directed       Follow-up Information     Jones Broom, MD. Schedule an appointment as soon as possible for a visit in 2 weeks.   Specialty: Orthopedic Surgery Contact information: [address] [place] [address] [phone]            Signed: Jiles Harold 05/20/2019, 9:23 AM

## 2019-05-20 NOTE — Anesthesia Postprocedure Evaluation (Signed)
Anesthesia Post Note  Patient: Lance Gray  Procedure(s) Performed: REVERSE TOTAL SHOULDER ARTHROPLASTY (Right Shoulder)     Patient location during evaluation: PACU Anesthesia Type: Regional and General Level of consciousness: awake and alert Pain management: pain level controlled Vital Signs Assessment: post-procedure vital signs reviewed and stable Respiratory status: spontaneous breathing, nonlabored ventilation, respiratory function stable and patient connected to nasal cannula oxygen Cardiovascular status: blood pressure returned to baseline and stable Postop Assessment: no apparent nausea or vomiting Anesthetic complications: no    Last Vitals:  Vitals:   05/20/19 0134 05/20/19 0542  BP: 127/86 135/86  Pulse: 74 68  Resp: 14 16  Temp: 36.7 C 36.6 C  SpO2: 97% 99%    Last Pain:  Vitals:   05/20/19 0932  TempSrc:   PainSc: 0-No pain                 Promise Bushong

## 2019-05-20 NOTE — Progress Notes (Signed)
   05/20/19 1212  OT Visit Information  Last OT Received On 05/20/19  Assistance Needed +1  History of Present Illness 73 year old man was admitted after R RTSA.    Precautions  Precautions Shoulder  Type of Shoulder Precautions shoulder  Shoulder Interventions Shoulder sling/immobilizer  Precaution Booklet Issued Yes (comment)  Precaution Comments sling off during adls and exercise only; elbow wrist and finger ROM allowed, no shoulder ROM  Pain Assessment  Pain Assessment No/denies pain  Cognition  Arousal/Alertness Awake/alert  Behavior During Therapy WFL for tasks assessed/performed  Overall Cognitive Status Within Functional Limits for tasks assessed  ADL  General ADL Comments wife present. Reviewed distal exercises allowed, no shoulder movement, how to don shirt and wash under arm without moving arm (simulated with new gown).  Wife watched me don sling then did it herself with min cues. Pt is in a standard sling.  Pt did start to walk towards bathroom without sling and cued that sling must be on except for bathing/dressing and exercises  Restrictions  Other Position/Activity Restrictions NWB R shoulder  Transfers  General transfer comment supervison  OT - End of Session  Activity Tolerance Patient tolerated treatment well  Patient left in chair;with call bell/phone within reach  OT Assessment/Plan  OT Visit Diagnosis Muscle weakness (generalized) (M62.81)  OT Frequency (ACUTE ONLY) Min 1X/week  Follow Up Recommendations Follow surgeon's recommendation for DC plan and follow-up therapies  OT Equipment None recommended by OT  AM-PAC OT "6 Clicks" Daily Activity Outcome Measure (Version 2)  Help from another person eating meals? 3  Help from another person taking care of personal grooming? 3  Help from another person toileting, which includes using toliet, bedpan, or urinal? 3  Help from another person bathing (including washing, rinsing, drying)? 2  Help from another person to  put on and taking off regular upper body clothing? 2  Help from another person to put on and taking off regular lower body clothing? 2  6 Click Score 15  OT Goal Progression  Progress towards OT goals Goals met/education completed, patient discharged from OT  OT Time Calculation  OT Start Time (ACUTE ONLY) 1045  OT Stop Time (ACUTE ONLY) 1056  OT Time Calculation (min) 11 min  OT General Charges  $OT Visit 1 Visit  OT Treatments  $Self Care/Home Management  8-22 mins  Lesle Chris, OTR/L Acute Rehabilitation Services [phone] [facility] pager [phone] office 05/20/2019

## 2019-05-20 NOTE — Progress Notes (Signed)
   PATIENT ID: Lance Gray   1 Day Post-Op Procedure(s) (LRB): REVERSE TOTAL SHOULDER ARTHROPLASTY (Right)  Subjective: Reports doing better this am, no chest pain, SOB, lightheadedness or dizziness. Up with OT this am.   Objective:  Vitals:   05/20/19 0134 05/20/19 0542  BP: 127/86 135/86  Pulse: 74 68  Resp: 14 16  Temp: 98 F (36.7 C) 97.8 F (36.6 C)  SpO2: 97% 99%     R Shoulder dressing c/d/i Wiggles fingers, distally NVI  Labs:  Recent Labs    05/20/19 0242  HGB 12.5*   Recent Labs    05/20/19 0242  WBC 10.6*  RBC 4.08*  HCT 37.9*  PLT 195   Recent Labs    05/20/19 0242  NA 139  K 4.0  CL 110  CO2 22  BUN 14  CREATININE 0.72  GLUCOSE 135*  CALCIUM 8.4*    Assessment and Plan: 1 day s/p reverse TSA Vitals improved, neg EKG and now asymptomatic D/c home today Follow up with Dr. Tamera Punt in 2 weeks  VTE proph: asa, scds

## 2019-05-20 NOTE — Plan of Care (Signed)
  Problem: Education: Goal: Knowledge of General Education information will improve Description: Including pain rating scale, medication(s)/side effects and non-pharmacologic comfort measures Outcome: Progressing   Problem: Clinical Measurements: Goal: Respiratory complications will improve Outcome: Progressing Goal: Cardiovascular complication will be avoided Outcome: Progressing   Problem: Activity: Goal: Risk for activity intolerance will decrease Outcome: Progressing   Problem: Coping: Goal: Level of anxiety will decrease Outcome: Progressing   Problem: Pain Managment: Goal: General experience of comfort will improve Outcome: Progressing   

## 2019-05-20 NOTE — Evaluation (Signed)
Occupational Therapy Evaluation Patient Details Name: Lance Gray MRN: [id number] DOB: Feb 14, 1946 Today's Date: 05/20/2019    History of Present Illness 73 year old man was admitted after R RTSA.     Clinical Impression   Pt was admitted for the above.  At baseline he is independent with adls. Reviewed shoulder exercises (distal) and precautions and performed ADL, functional mobility to bathroom.  Will return at pt's request to reinforce education with wife.    Follow Up Recommendations  Follow surgeon's recommendation for DC plan and follow-up therapies    Equipment Recommendations  None recommended by OT    Recommendations for Other Services       Precautions / Restrictions Precautions Precautions: Shoulder Type of Shoulder Precautions: shoulder Shoulder Interventions: Shoulder sling/immobilizer Precaution Booklet Issued: Yes (comment) Precaution Comments: sling off during adls and exercise only; elbow wrist and finger ROM allowed, no shoulder ROM Restrictions Weight Bearing Restrictions: Yes Other Position/Activity Restrictions: NWB R shoulder      Mobility Bed Mobility               General bed mobility comments: min guard; pt used momentum for OOB  Transfers                 General transfer comment: supervison    Balance                                           ADL either performed or assessed with clinical judgement   ADL Overall ADL's : Needs assistance/impaired Eating/Feeding: Set up   Grooming: Set up   Upper Body Bathing: Minimal assistance   Lower Body Bathing: Moderate assistance   Upper Body Dressing : Moderate assistance   Lower Body Dressing: Maximal assistance   Toilet Transfer: Supervision/safety;Ambulation   Toileting- Clothing Manipulation and Hygiene: Minimal assistance         General ADL Comments: performed adl; educated on precautions.  Pt needed cues not to use RUE     Vision        Perception     Praxis      Pertinent Vitals/Pain Pain Assessment: No/denies pain     Hand Dominance Right   Extremity/Trunk Assessment Upper Extremity Assessment Upper Extremity Assessment: RUE deficits/detail RUE Deficits / Details: able to move fingers and wrist           Communication Communication Communication: No difficulties   Cognition Arousal/Alertness: Awake/alert Behavior During Therapy: WFL for tasks assessed/performed Overall Cognitive Status: Within Functional Limits for tasks assessed                                 General Comments: Pt reports he slept very poorly and wants me to come back to educate wife later   General Comments  Pt reports he had a syncopal episode yesterday.  No problems during OT session    Exercises     Shoulder Instructions      Home Living Family/patient expects to be discharged to:: Private residence Living Arrangements: Spouse/significant other                                      Prior Functioning/Environment Level of Independence: Independent  OT Problem List:        OT Treatment/Interventions:      OT Goals(Current goals can be found in the care plan section) Acute Rehab OT Goals Patient Stated Goal: return to independence and walking a mile a day OT Goal Formulation: With patient Time For Goal Achievement: 05/27/19 Potential to Achieve Goals: Good ADL Goals Additional ADL Goal #1: wife will don sling with supervision and verbalize understanding of precautions, how to assist with UB adls.  OT Frequency:     Barriers to D/C:            Co-evaluation              AM-PAC OT "6 Clicks" Daily Activity     Outcome Measure Help from another person eating meals?: A Little Help from another person taking care of personal grooming?: A Little Help from another person toileting, which includes using toliet, bedpan, or urinal?: A Little Help from another  person bathing (including washing, rinsing, drying)?: A Lot Help from another person to put on and taking off regular upper body clothing?: A Lot Help from another person to put on and taking off regular lower body clothing?: A Lot 6 Click Score: 15   End of Session    Activity Tolerance: Patient tolerated treatment well Patient left: in chair;with call bell/phone within reach  OT Visit Diagnosis: Muscle weakness (generalized) (M62.81)                Time: 1324-4010 OT Time Calculation (min): 31 min Charges:  OT General Charges $OT Visit: 1 Visit OT Evaluation $OT Eval Low Complexity: 1 Low OT Treatments $Self Care/Home Management : 8-22 mins  Marica Otter, OTR/L Acute Rehabilitation Services [phone] [facility] pager [phone] office 05/20/2019  Lulamae Skorupski 05/20/2019, 12:10 PM

## 2019-05-23 ENCOUNTER — Encounter (HOSPITAL_COMMUNITY): Payer: Self-pay | Admitting: Orthopedic Surgery

## 2019-08-20 ENCOUNTER — Ambulatory Visit: Payer: Medicare Other | Attending: Internal Medicine

## 2019-08-20 NOTE — Progress Notes (Signed)
   Covid-19 Vaccination Clinic  Name:  Lance Gray    MRN: [id number] DOB: [birth year]  08/20/2019  [name] was observed post Covid-19 immunization for 15 minutes without incidence. He was provided with Vaccine Information Sheet and instruction to access the V-Safe system.   [name] was instructed to call 911 with any severe reactions post vaccine: Marland Kitchen Difficulty breathing  . Swelling of your face and throat  . A fast heartbeat  . A bad rash all over your body  . Dizziness and weakness

## 2019-09-05 ENCOUNTER — Ambulatory Visit: Payer: Medicare Other

## 2019-09-10 ENCOUNTER — Ambulatory Visit: Payer: Medicare Other | Attending: Internal Medicine

## 2019-09-10 DIAGNOSIS — Z23 Encounter for immunization: Secondary | ICD-10-CM

## 2019-09-10 NOTE — Progress Notes (Signed)
   Covid-19 Vaccination Clinic  Name:  Lance Gray    MRN: [id number] DOB: 1946-02-07  09/10/2019  Mr. Lance Gray was observed post Covid-19 immunization for 15 minutes without incidence. He was provided with Vaccine Information Sheet and instruction to access the V-Safe system.   Mr. Lance Gray was instructed to call 911 with any severe reactions post vaccine: Marland Kitchen Difficulty breathing  . Swelling of your face and throat  . A fast heartbeat  . A bad rash all over your body  . Dizziness and weakness    Immunizations Administered    Name Date Dose VIS Date Route   Pfizer COVID-19 Vaccine 09/10/2019  1:10 PM 0.3 mL 07/08/2019 Intramuscular   Manufacturer: [employer], [employer]   Lot: [id number]   NDC: [address]-0

## 2019-11-03 ENCOUNTER — Ambulatory Visit (INDEPENDENT_AMBULATORY_CARE_PROVIDER_SITE_OTHER): Payer: Medicare Other | Admitting: Ophthalmology

## 2019-11-03 ENCOUNTER — Other Ambulatory Visit: Payer: Self-pay

## 2019-11-03 ENCOUNTER — Encounter (INDEPENDENT_AMBULATORY_CARE_PROVIDER_SITE_OTHER): Payer: Self-pay | Admitting: Ophthalmology

## 2019-11-03 DIAGNOSIS — H21552 Recession of chamber angle, left eye: Secondary | ICD-10-CM

## 2019-11-03 DIAGNOSIS — H4060X0 Glaucoma secondary to drugs, unspecified eye, stage unspecified: Secondary | ICD-10-CM

## 2019-11-03 DIAGNOSIS — T380X5A Adverse effect of glucocorticoids and synthetic analogues, initial encounter: Secondary | ICD-10-CM | POA: Diagnosis not present

## 2019-11-03 DIAGNOSIS — H4032X3 Glaucoma secondary to eye trauma, left eye, severe stage: Secondary | ICD-10-CM

## 2019-11-03 NOTE — Progress Notes (Signed)
11/03/2019     CHIEF COMPLAINT Patient presents for Retina Follow Up   HISTORY OF PRESENT ILLNESS: Lance Gray is a 74 y.o. male who presents to the clinic today for:   HPI    Retina Follow Up    Patient presents with  Other.  In left eye.  Duration of 2 weeks.  Since onset it is stable.          Comments    2 week follow up - IOP check Patient using Brimonidine TID OS and Timolol BID OS. Patient states that he has only used Travoprost twice because it made his eye 'pulsate'.        Last edited by Gerda Diss, Playita Cortada on 11/03/2019  2:02 PM. (History)      Referring [place]: [place] [address] [place],  [place] [address]  HISTORICAL INFORMATION:   Selected notes from the MEDICAL RECORD NUMBER       CURRENT MEDICATIONS: Current Outpatient Medications (Ophthalmic Drugs)  Medication Sig  . brimonidine (ALPHAGAN) 0.15 % ophthalmic solution Place 1 drop into the left eye 2 (two) times daily.  . Carboxymethylcell-Glycerin PF (REFRESH RELIEVA PF) 0.5-0.9 % SOLN Place 1 drop into the left eye 3 (three) times daily as needed (dry eyes).  . timolol (TIMOPTIC) 0.5 % ophthalmic solution Place 1 drop into the left eye 2 (two) times daily.   No current facility-administered medications for this visit. (Ophthalmic Drugs)   Current Outpatient Medications (Other)  Medication Sig  . alfuzosin (UROXATRAL) 10 MG 24 hr tablet Take 10 mg by mouth daily with breakfast.  . lovastatin (MEVACOR) 20 MG tablet Take 40 mg by mouth at bedtime.  . Multiple Vitamins-Minerals (MULTIVITAMIN WITH MINERALS) tablet Take 1 tablet by mouth daily.  . temazepam (RESTORIL) 30 MG capsule Take 30 mg by mouth at bedtime.  Marland Kitchen oxyCODONE-acetaminophen (PERCOCET) 5-325 MG tablet Take 1-2 tablets every 4 hours as needed for post operative pain. MAX 6/day (Patient not taking: Reported on 11/03/2019)  . tiZANidine (ZANAFLEX) 4 MG tablet Take 1 tablet (4 mg total) by mouth every 8 (eight) hours as  needed for muscle spasms. (Patient not taking: Reported on 11/03/2019)   No current facility-administered medications for this visit. (Other)      REVIEW OF SYSTEMS:    ALLERGIES No Known Allergies  PAST MEDICAL HISTORY Past Medical History:  Diagnosis Date  . Arthritis    Past Surgical History:  Procedure Laterality Date  . EYE SURGERY Left 1993   Cataract  . EYE SURGERY  2018   Lens  . JOINT REPLACEMENT    . KNEE ARTHROSCOPY Right 1998  . TOTAL SHOULDER ARTHROPLASTY Right 05/19/2019   Procedure: REVERSE TOTAL SHOULDER ARTHROPLASTY;  Surgeon: Tania Ade, MD;  Location: [facility];  Service: Orthopedics;  Laterality: Right;    FAMILY HISTORY History reviewed. No pertinent family history.  SOCIAL HISTORY Social History   Tobacco Use  . Smoking status: Never Smoker  . Smokeless tobacco: Never Used  Substance Use Topics  . Alcohol use: Never  . Drug use: Never         OPHTHALMIC EXAM:  Base Eye Exam    Visual Acuity (Snellen - Linear)      Right Left   Dist cc 20/20 20/200   Dist ph cc  20/70 -1   Correction: Glasses       Tonometry (Tonopen, 2:08 PM)      Right Left   Pressure 18 12  Slit Lamp and Fundus Exam    External Exam      Right Left   External Normal Normal       Slit Lamp Exam      Right Left   Lids/Lashes Normal Normal   Conjunctiva/Sclera White and quiet White and quiet   Cornea Clear ,,,  mild straie,   Anterior Chamber Deep and quiet Deep and quiet   Iris Round and reactive Round and reactive   Lens 2+ Nuclear sclerosis,, aphakia,,,   Vitreous Normal Normal          IMAGING AND PROCEDURES  Imaging and Procedures for 11/03/19           ASSESSMENT/PLAN:  @PROBAPNOTE @  No diagnosis found.  1.  2.  3.  Ophthalmic Meds Ordered this visit:  No orders of the defined types were placed in this encounter.      No follow-ups on file.  There are no Patient Instructions on file for this  visit.   Explained the diagnoses, plan, and follow up with the patient and they expressed understanding.  Patient expressed understanding of the importance of proper follow up care.   Inocente Krach M.D. Diseases & Surgery of the Retina and Vitreous [facility] 11/03/19     Abbreviations: M myopia (nearsighted); A astigmatism; H hyperopia (farsighted); P presbyopia; Mrx spectacle prescription;  CTL contact lenses; OD right eye; OS left eye; OU both eyes  XT exotropia; ET esotropia; PEK punctate epithelial keratitis; PEE punctate epithelial erosions; DES dry eye syndrome; MGD meibomian gland dysfunction; ATs artificial tears; PFAT's preservative free artificial tears; NSC nuclear sclerotic cataract; PSC posterior subcapsular cataract; ERM epi-retinal membrane; PVD posterior vitreous detachment; RD retinal detachment; DM diabetes mellitus; DR diabetic retinopathy; NPDR non-proliferative diabetic retinopathy; PDR proliferative diabetic retinopathy; CSME clinically significant macular edema; DME diabetic macular edema; dbh dot blot hemorrhages; CWS cotton wool spot; POAG primary open angle glaucoma; C/D cup-to-disc ratio; HVF humphrey visual field; GVF goldmann visual field; OCT optical coherence tomography; IOP intraocular pressure; BRVO Branch retinal vein occlusion; CRVO central retinal vein occlusion; CRAO central retinal artery occlusion; BRAO branch retinal artery occlusion; RT retinal tear; SB scleral buckle; PPV pars plana vitrectomy; VH Vitreous hemorrhage; PRP panretinal laser photocoagulation; IVK intravitreal kenalog; VMT vitreomacular traction; MH Macular hole;  NVD neovascularization of the disc; NVE neovascularization elsewhere; AREDS age related eye disease study; ARMD age related macular degeneration; POAG primary open angle glaucoma; EBMD epithelial/anterior basement membrane dystrophy; ACIOL anterior chamber intraocular lens; IOL intraocular lens; PCIOL posterior chamber  intraocular lens; Phaco/IOL phacoemulsification with intraocular lens placement; PRK photorefractive keratectomy; LASIK laser assisted in situ keratomileusis; HTN hypertension; DM diabetes mellitus; COPD chronic obstructive pulmonary disease

## 2019-12-01 ENCOUNTER — Other Ambulatory Visit: Payer: Self-pay

## 2019-12-01 ENCOUNTER — Ambulatory Visit (INDEPENDENT_AMBULATORY_CARE_PROVIDER_SITE_OTHER): Payer: Medicare Other | Admitting: Ophthalmology

## 2019-12-01 ENCOUNTER — Encounter (INDEPENDENT_AMBULATORY_CARE_PROVIDER_SITE_OTHER): Payer: Self-pay | Admitting: Ophthalmology

## 2019-12-01 DIAGNOSIS — H353122 Nonexudative age-related macular degeneration, left eye, intermediate dry stage: Secondary | ICD-10-CM

## 2019-12-01 DIAGNOSIS — R0683 Snoring: Secondary | ICD-10-CM | POA: Insufficient documentation

## 2019-12-01 DIAGNOSIS — H353112 Nonexudative age-related macular degeneration, right eye, intermediate dry stage: Secondary | ICD-10-CM | POA: Diagnosis not present

## 2019-12-01 DIAGNOSIS — H2702 Aphakia, left eye: Secondary | ICD-10-CM | POA: Insufficient documentation

## 2019-12-01 NOTE — Progress Notes (Signed)
12/01/2019     CHIEF COMPLAINT Patient presents for Retina Follow Up   HISTORY OF PRESENT ILLNESS: Lance Gray is a 74 y.o. male who presents to the clinic today for:   HPI    Retina Follow Up    Patient presents with  Other.  In both eyes.  Duration of 4 weeks.  Since onset it is stable.          Comments    4 week follow up - OCT OU, IOP check (No dilation) Patient denies change in vision and overall has no complaints. Brimondine & Timolol BID OS       Last edited by Berenice Bouton on 12/01/2019  1:39 PM. (History)      Referring [place]: [place] [address] [place],  [place] [address]  HISTORICAL INFORMATION:   Selected notes from the MEDICAL RECORD NUMBER       CURRENT MEDICATIONS: Current Outpatient Medications (Ophthalmic Drugs)  Medication Sig  . brimonidine (ALPHAGAN) 0.15 % ophthalmic solution Place 1 drop into the left eye 2 (two) times daily.  . Carboxymethylcell-Glycerin PF (REFRESH RELIEVA PF) 0.5-0.9 % SOLN Place 1 drop into the left eye 3 (three) times daily as needed (dry eyes).  . timolol (TIMOPTIC) 0.5 % ophthalmic solution Place 1 drop into the left eye 2 (two) times daily.   No current facility-administered medications for this visit. (Ophthalmic Drugs)   Current Outpatient Medications (Other)  Medication Sig  . alfuzosin (UROXATRAL) 10 MG 24 hr tablet Take 10 mg by mouth daily with breakfast.  . lovastatin (MEVACOR) 20 MG tablet Take 40 mg by mouth at bedtime.  . Multiple Vitamins-Minerals (MULTIVITAMIN WITH MINERALS) tablet Take 1 tablet by mouth daily.  Marland Kitchen oxyCODONE-acetaminophen (PERCOCET) 5-325 MG tablet Take 1-2 tablets every 4 hours as needed for post operative pain. MAX 6/day (Patient not taking: Reported on 11/03/2019)  . temazepam (RESTORIL) 30 MG capsule Take 30 mg by mouth at bedtime.  Marland Kitchen tiZANidine (ZANAFLEX) 4 MG tablet Take 1 tablet (4 mg total) by mouth every 8 (eight) hours as needed for muscle spasms.  (Patient not taking: Reported on 11/03/2019)   No current facility-administered medications for this visit. (Other)      REVIEW OF SYSTEMS:    ALLERGIES No Known Allergies  PAST MEDICAL HISTORY Past Medical History:  Diagnosis Date  . Arthritis    Past Surgical History:  Procedure Laterality Date  . EYE SURGERY Left 1993   Cataract  . EYE SURGERY  2018   Lens  . JOINT REPLACEMENT    . KNEE ARTHROSCOPY Right 1998  . TOTAL SHOULDER ARTHROPLASTY Right 05/19/2019   Procedure: REVERSE TOTAL SHOULDER ARTHROPLASTY;  Surgeon: Jones Broom, MD;  Location: [facility];  Service: Orthopedics;  Laterality: Right;    FAMILY HISTORY History reviewed. No pertinent family history.  SOCIAL HISTORY Social History   Tobacco Use  . Smoking status: Never Smoker  . Smokeless tobacco: Never Used  Substance Use Topics  . Alcohol use: Never  . Drug use: Never         OPHTHALMIC EXAM:  Base Eye Exam    Visual Acuity (Snellen - Linear)      Right Left   Dist cc 20/20 20/200   Dist ph cc  20/60-2   Correction: Glasses       Tonometry (Tonopen, 1:49 PM)      Right Left   Pressure 11 11       Pupils  Pupils Dark Light Shape React APD   Right PERRL 4 4 Round Slow None   Left PERRL 4 4 Irregular Slow None       Visual Fields (Counting fingers)      Left Right    Full Full       Extraocular Movement      Right Left    Full Full       Neuro/Psych    Oriented x3: Yes   Mood/Affect: Normal        Slit Lamp and Fundus Exam    External Exam      Right Left   External Normal Normal       Slit Lamp Exam      Right Left   Lids/Lashes Normal Normal   Conjunctiva/Sclera White and quiet White and quiet   Cornea Clear Clear   Anterior Chamber Deep and quiet Deep and quiet   Iris Round and reactive Round and reactive   Lens  Aphakia   Vitreous Normal Normal          IMAGING AND PROCEDURES  Imaging and Procedures for 12/01/19  OCT, Retina - OU - Both Eyes        Right Eye Quality was good. Scan locations included subfoveal. Progression has been stable. Findings include retinal drusen .   Left Eye Quality was good. Scan locations included subfoveal. Central Foveal Thickness: 334. Progression has been stable. Findings include retinal drusen .   Notes OD, OS without active CME nor wet macular degeneration.  Retinal drusen in the right eye stable, left eye has a small central subfoveal drusen which has somewhat enlarged over the last 4 months.                ASSESSMENT/PLAN:  No problem-specific Assessment & Plan notes found for this encounter.      ICD-10-CM   1. Intermediate stage nonexudative age-related macular degeneration of right eye  H35.3112 OCT, Retina - OU - Both Eyes  2. Intermediate stage nonexudative age-related macular degeneration of left eye  H35.3122   3. Aphakia of eye, left  H27.02   4. Snores  R06.83     1.  2.  3.  Ophthalmic Meds Ordered this visit:  No orders of the defined types were placed in this encounter.      Return in about 2 months (around 01/31/2020) for NO DILATE, OCT.  There are no Patient Instructions on file for this visit.   Explained the diagnoses, plan, and follow up with the patient and they expressed understanding.  Patient expressed understanding of the importance of proper follow up care.   Clent Demark Gauge Winski M.D. Diseases & Surgery of the Retina and Vitreous [facility] [place] 12/01/19     Abbreviations: M myopia (nearsighted); A astigmatism; H hyperopia (farsighted); P presbyopia; Mrx spectacle prescription;  CTL contact lenses; OD right eye; OS left eye; OU both eyes  XT exotropia; ET esotropia; PEK punctate epithelial keratitis; PEE punctate epithelial erosions; DES dry eye syndrome; MGD meibomian gland dysfunction; ATs artificial tears; PFAT's preservative free artificial tears; [place] nuclear sclerotic cataract; PSC posterior subcapsular cataract; ERM  epi-retinal membrane; PVD posterior vitreous detachment; RD retinal detachment; DM diabetes mellitus; DR diabetic retinopathy; NPDR non-proliferative diabetic retinopathy; PDR proliferative diabetic retinopathy; CSME clinically significant macular edema; DME diabetic macular edema; dbh dot blot hemorrhages; CWS cotton wool spot; POAG primary open angle glaucoma; C/D cup-to-disc ratio; HVF humphrey visual field; GVF goldmann visual field; OCT optical  coherence tomography; IOP intraocular pressure; BRVO Branch retinal vein occlusion; CRVO central retinal vein occlusion; CRAO central retinal artery occlusion; BRAO branch retinal artery occlusion; RT retinal tear; SB scleral buckle; PPV pars plana vitrectomy; VH Vitreous hemorrhage; PRP panretinal laser photocoagulation; IVK intravitreal kenalog; VMT vitreomacular traction; MH Macular hole;  NVD neovascularization of the disc; NVE neovascularization elsewhere; AREDS age related eye disease study; ARMD age related macular degeneration; POAG primary open angle glaucoma; EBMD epithelial/anterior basement membrane dystrophy; ACIOL anterior chamber intraocular lens; IOL intraocular lens; PCIOL posterior chamber intraocular lens; Phaco/IOL phacoemulsification with intraocular lens placement; [place] photorefractive keratectomy; LASIK laser assisted in situ keratomileusis; HTN hypertension; DM diabetes mellitus; COPD chronic obstructive pulmonary disease

## 2020-01-31 ENCOUNTER — Encounter (INDEPENDENT_AMBULATORY_CARE_PROVIDER_SITE_OTHER): Payer: Medicare Other | Admitting: Ophthalmology

## 2020-03-01 ENCOUNTER — Encounter (INDEPENDENT_AMBULATORY_CARE_PROVIDER_SITE_OTHER): Payer: Medicare Other | Admitting: Ophthalmology

## 2020-10-13 IMAGING — CR DG CHEST 2V
2 series · 2 of 2 positions shown · non-contrast
Comparison: None.

CLINICAL DATA: Pre op total shoulder replacement - never a smoker -
pt states no there chest hx or complaints

EXAM:
CHEST - 2 VIEW

[w chest pa]
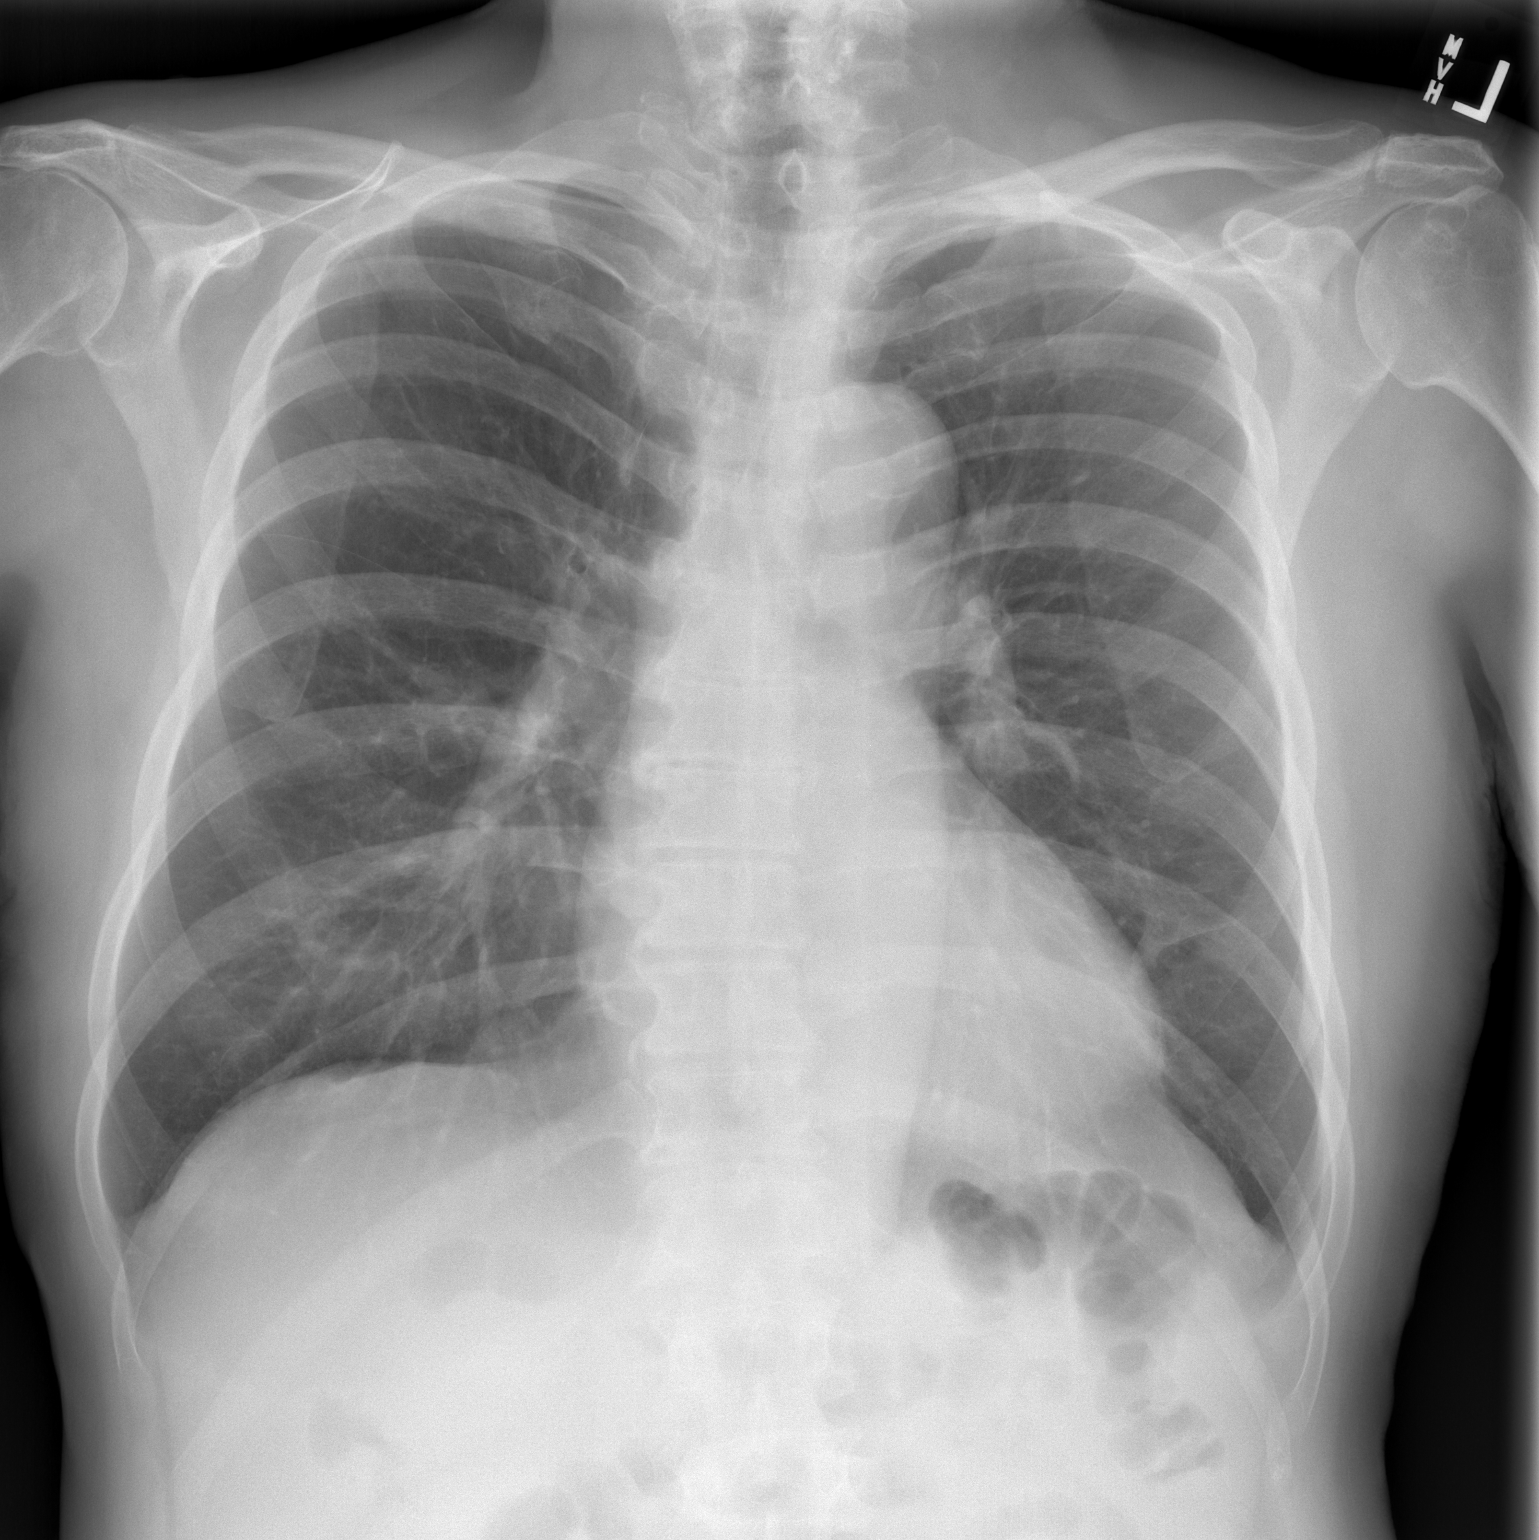

[w chest lat]
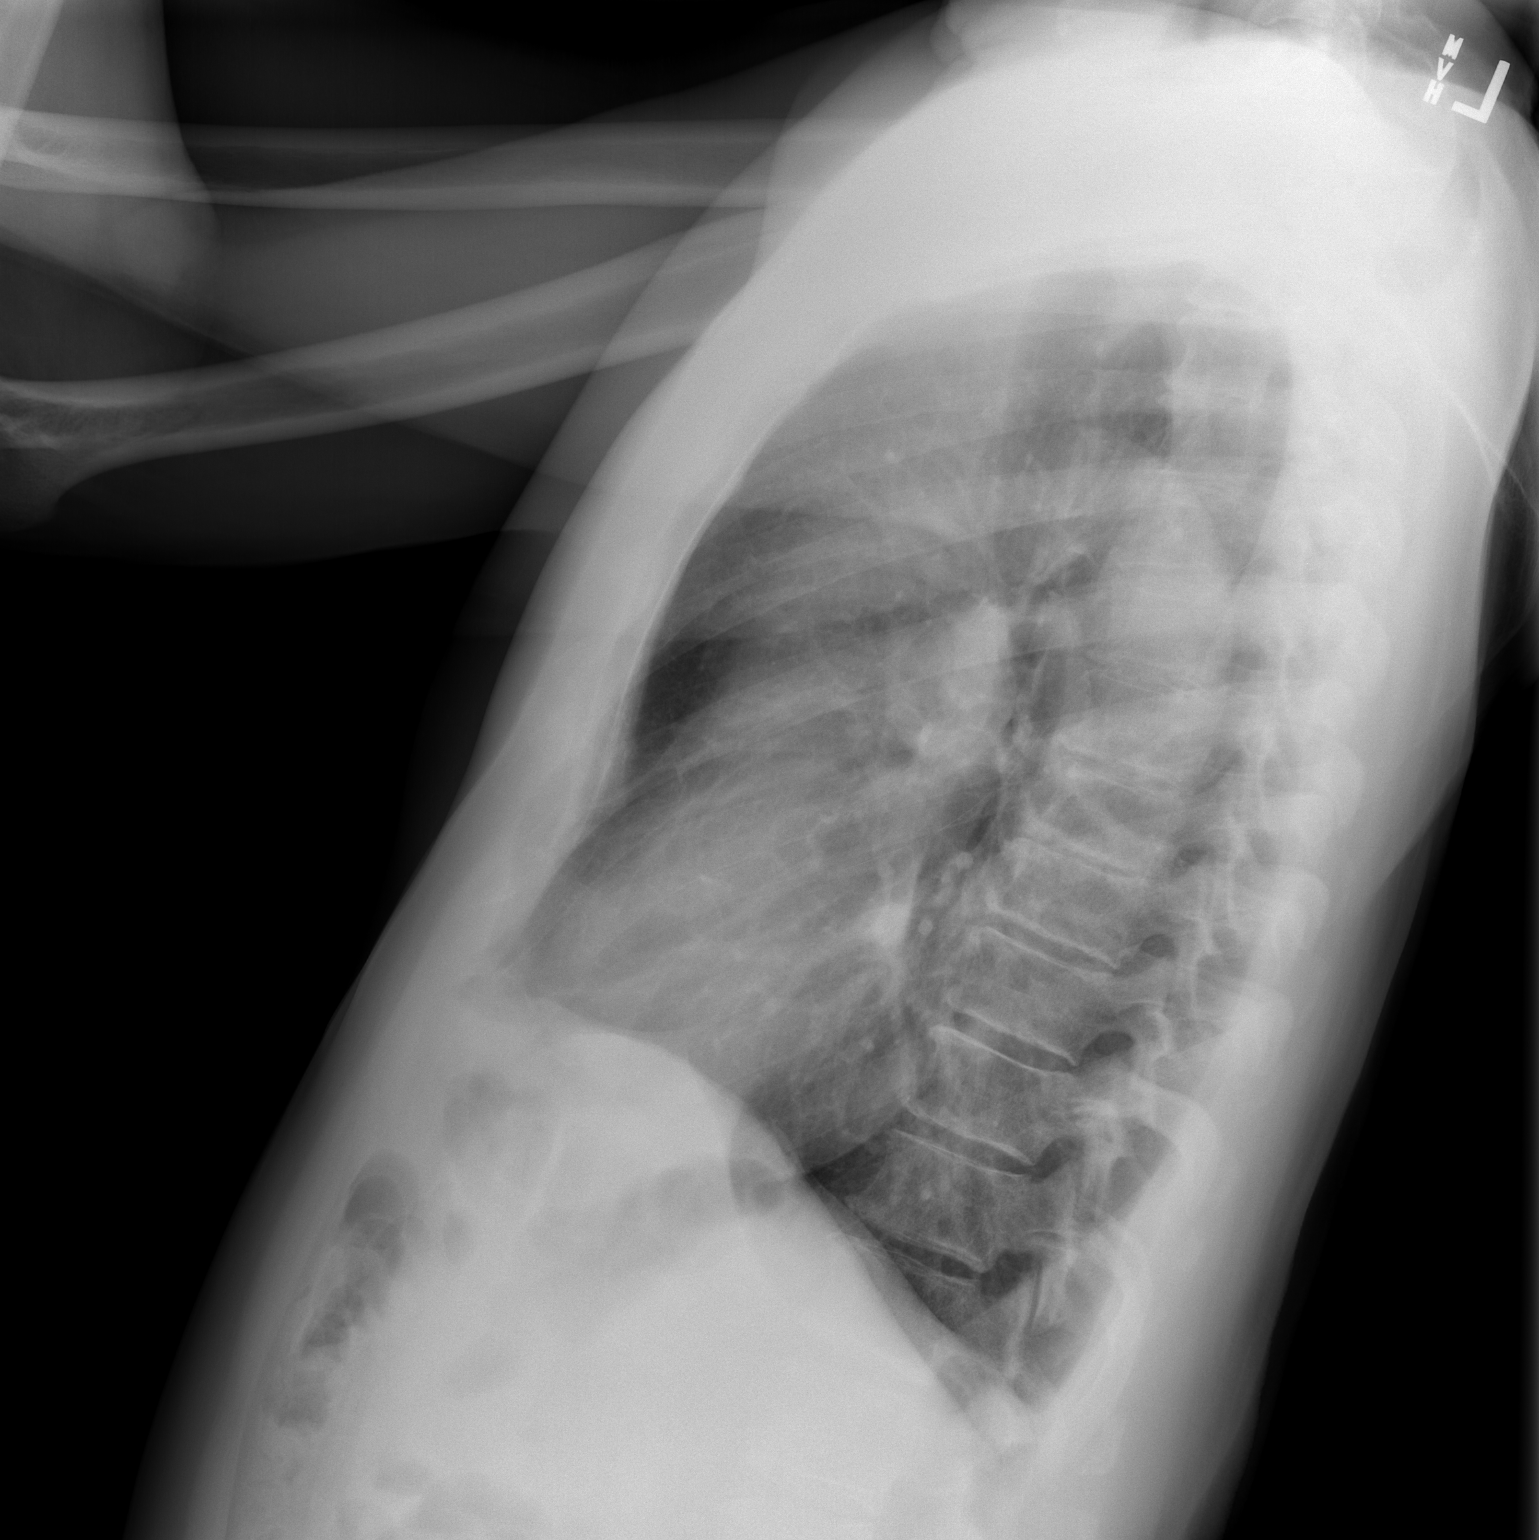

[2 of 2 positions shown; findings below may reference images not displayed]

FINDINGS: Normal mediastinum and cardiac silhouette. Normal pulmonary
vasculature. No evidence of effusion, infiltrate, or pneumothorax.
No acute bony abnormality.
IMPRESSION: No acute cardiopulmonary process.

## 2020-10-16 IMAGING — DX DG SHOULDER 2+V PORT*R*
1 series · 1 of 1 positions shown · non-contrast
Comparison: December 21, 2018.

CLINICAL DATA: Status post right shoulder arthroplasty.

EXAM:
PORTABLE RIGHT SHOULDER

[shoulder ap]
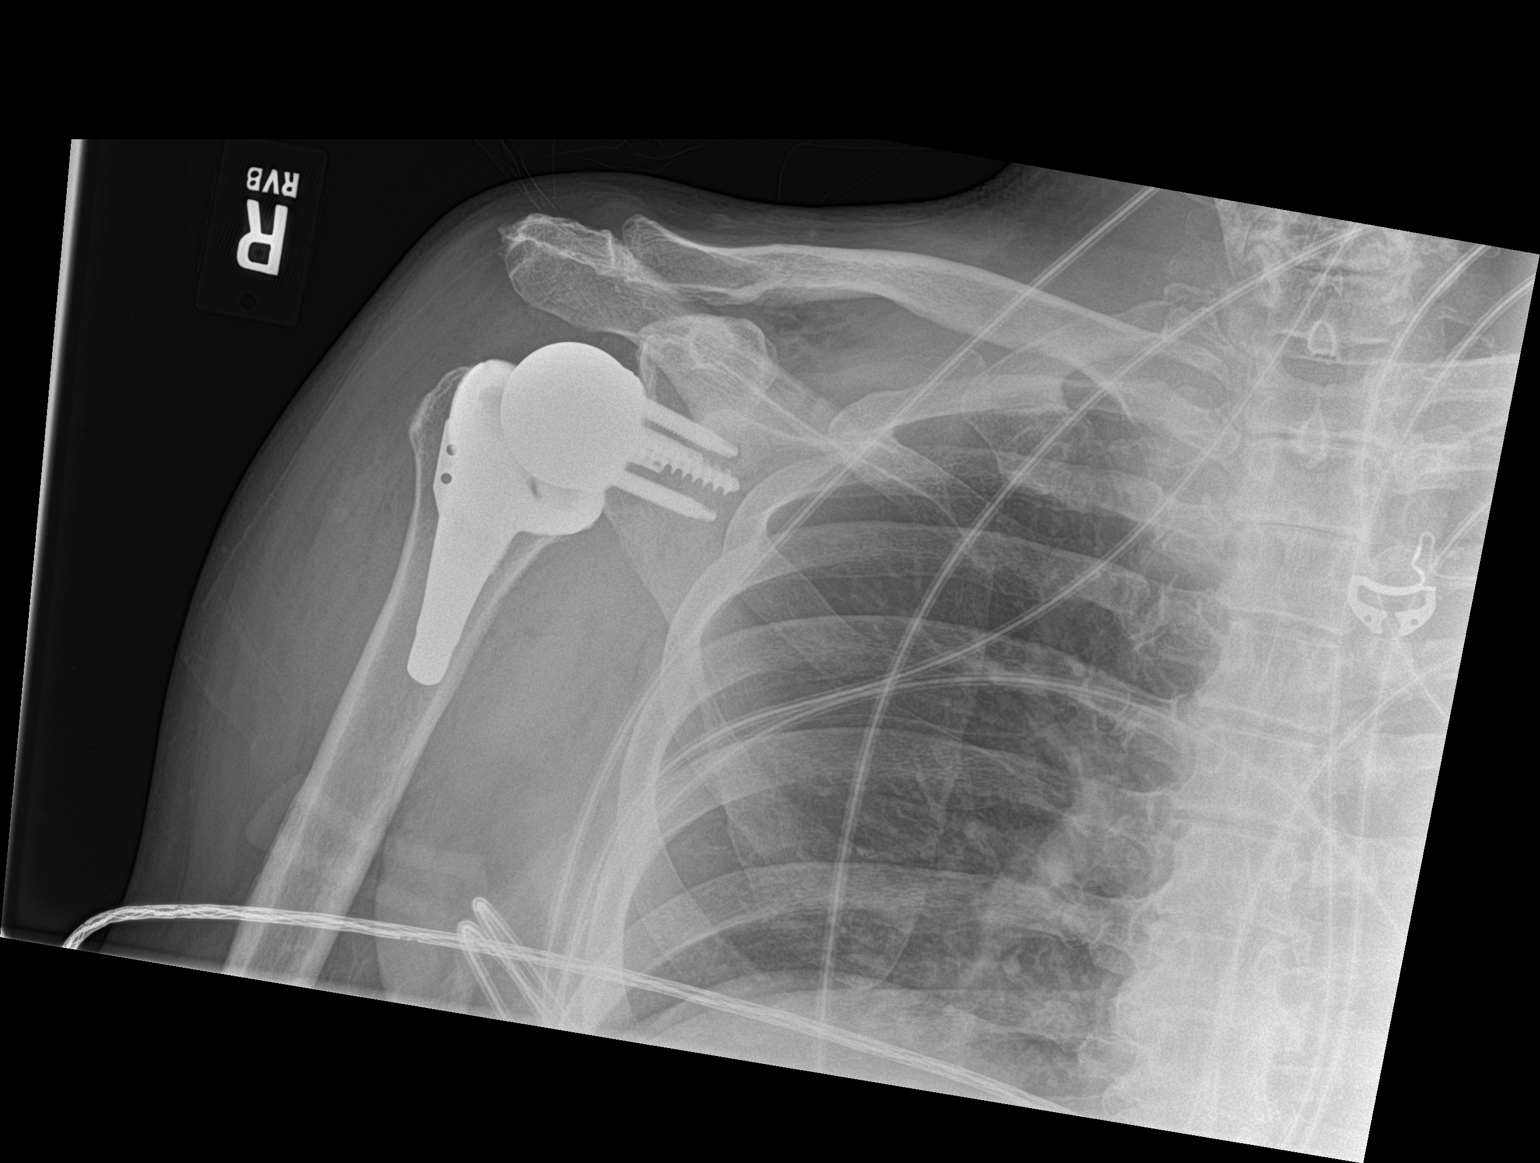

[1 of 1 positions shown; findings below may reference images not displayed]

FINDINGS: The right glenoid and humeral components appear to be well situated.
No fracture or dislocation is noted. Mild degenerative changes seen
involving the right acromioclavicular joint. Visualized ribs are
unremarkable.
IMPRESSION: Status post right total shoulder arthroplasty.

## 2023-06-20 LAB — COLOGUARD: COLOGUARD: NEGATIVE
